# Patient Record
Sex: Female | Born: 1955 | Race: White | Hispanic: No | Marital: Married | Smoking: Former smoker
Health system: Southern US, Community
[De-identification: ages and names within clinical notes are randomized; demographics above are authoritative.]

## PROBLEM LIST (undated history)

## (undated) DIAGNOSIS — J45909 Unspecified asthma, uncomplicated: Secondary | ICD-10-CM

## (undated) DIAGNOSIS — M2012 Hallux valgus (acquired), left foot: Secondary | ICD-10-CM

## (undated) DIAGNOSIS — M21612 Bunion of left foot: Secondary | ICD-10-CM

## (undated) DIAGNOSIS — D649 Anemia, unspecified: Secondary | ICD-10-CM

## (undated) DIAGNOSIS — E785 Hyperlipidemia, unspecified: Secondary | ICD-10-CM

## (undated) DIAGNOSIS — R7303 Prediabetes: Secondary | ICD-10-CM

## (undated) DIAGNOSIS — T7840XA Allergy, unspecified, initial encounter: Secondary | ICD-10-CM

## (undated) DIAGNOSIS — F419 Anxiety disorder, unspecified: Secondary | ICD-10-CM

## (undated) DIAGNOSIS — M1990 Unspecified osteoarthritis, unspecified site: Secondary | ICD-10-CM

## (undated) DIAGNOSIS — J189 Pneumonia, unspecified organism: Secondary | ICD-10-CM

## (undated) DIAGNOSIS — K219 Gastro-esophageal reflux disease without esophagitis: Secondary | ICD-10-CM

## (undated) HISTORY — DX: Anxiety disorder, unspecified: F41.9

## (undated) HISTORY — PX: TONSILLECTOMY AND ADENOIDECTOMY: SUR1326

## (undated) HISTORY — DX: Gastro-esophageal reflux disease without esophagitis: K21.9

## (undated) HISTORY — PX: ABDOMINAL HYSTERECTOMY: SHX81

## (undated) HISTORY — DX: Allergy, unspecified, initial encounter: T78.40XA

## (undated) HISTORY — DX: Unspecified osteoarthritis, unspecified site: M19.90

## (undated) HISTORY — PX: OOPHORECTOMY: SHX86

## (undated) HISTORY — DX: Hyperlipidemia, unspecified: E78.5

## (undated) HISTORY — PX: CARPAL TUNNEL RELEASE: SHX101

---

## 2006-10-20 DIAGNOSIS — Z87898 Personal history of other specified conditions: Secondary | ICD-10-CM | POA: Insufficient documentation

## 2006-10-20 DIAGNOSIS — E229 Hyperfunction of pituitary gland, unspecified: Secondary | ICD-10-CM

## 2006-10-20 DIAGNOSIS — F419 Anxiety disorder, unspecified: Secondary | ICD-10-CM | POA: Insufficient documentation

## 2006-10-20 DIAGNOSIS — E7800 Pure hypercholesterolemia, unspecified: Secondary | ICD-10-CM | POA: Insufficient documentation

## 2006-10-20 DIAGNOSIS — F32A Depression, unspecified: Secondary | ICD-10-CM | POA: Insufficient documentation

## 2006-10-20 DIAGNOSIS — F329 Major depressive disorder, single episode, unspecified: Secondary | ICD-10-CM

## 2006-11-08 DIAGNOSIS — M949 Disorder of cartilage, unspecified: Secondary | ICD-10-CM

## 2006-11-08 DIAGNOSIS — M899 Disorder of bone, unspecified: Secondary | ICD-10-CM | POA: Insufficient documentation

## 2007-04-01 DIAGNOSIS — K219 Gastro-esophageal reflux disease without esophagitis: Secondary | ICD-10-CM | POA: Insufficient documentation

## 2007-09-02 DIAGNOSIS — E559 Vitamin D deficiency, unspecified: Secondary | ICD-10-CM | POA: Insufficient documentation

## 2008-07-16 DIAGNOSIS — J45909 Unspecified asthma, uncomplicated: Secondary | ICD-10-CM | POA: Insufficient documentation

## 2009-09-28 HISTORY — PX: KNEE SURGERY: SHX244

## 2010-02-28 HISTORY — PX: COLONOSCOPY: SHX174

## 2010-02-28 HISTORY — PX: UPPER GASTROINTESTINAL ENDOSCOPY: SHX188

## 2013-05-22 LAB — BASIC METABOLIC PANEL
BUN: 14 mg/dL (ref 4–21)
Creatinine: 0.7 mg/dL (ref <=1.1)
GLUCOSE: 91 mg/dL
Potassium: 4.8 mmol/L (ref 3.4–5.3)
Sodium: 141 mmol/L (ref 137–147)

## 2013-05-22 LAB — HEPATIC FUNCTION PANEL
ALK PHOS: 89 U/L (ref 25–125)
ALT: 28 U/L (ref 7–35)
AST: 30 U/L (ref 13–35)
BILIRUBIN, TOTAL: 0.5 mg/dL

## 2014-07-03 LAB — CBC AND DIFFERENTIAL
HCT: 39 % (ref 36–46)
Hemoglobin: 13.3 g/dL (ref 12.0–16.0)
NEUTROS ABS: 2 /uL
PLATELETS: 254 10*3/uL (ref 150–399)
WBC: 4.4 10*3/mL

## 2014-07-03 LAB — TSH: TSH: 1.8 u[IU]/mL (ref 0.41–5.90)

## 2014-07-03 LAB — LIPID PANEL
Cholesterol: 276 mg/dL — AB (ref 0–200)
HDL: 101 mg/dL — AB (ref 35–70)
LDL CALC: 156 mg/dL
Triglycerides: 94 mg/dL (ref 40–160)

## 2014-09-12 ENCOUNTER — Other Ambulatory Visit: Payer: Self-pay | Admitting: Family Medicine

## 2014-12-11 ENCOUNTER — Other Ambulatory Visit: Payer: Self-pay | Admitting: Family Medicine

## 2015-02-10 ENCOUNTER — Encounter: Payer: Self-pay | Admitting: Family Medicine

## 2015-02-10 ENCOUNTER — Other Ambulatory Visit: Payer: Self-pay

## 2015-02-10 ENCOUNTER — Ambulatory Visit (INDEPENDENT_AMBULATORY_CARE_PROVIDER_SITE_OTHER): Payer: Managed Care, Other (non HMO) | Admitting: Family Medicine

## 2015-02-10 VITALS — BP 138/82 | HR 92 | Temp 98.2°F | Resp 16 | Wt 133.8 lb

## 2015-02-10 DIAGNOSIS — B029 Zoster without complications: Secondary | ICD-10-CM | POA: Insufficient documentation

## 2015-02-10 DIAGNOSIS — M7130 Other bursal cyst, unspecified site: Secondary | ICD-10-CM | POA: Insufficient documentation

## 2015-02-10 DIAGNOSIS — M8580 Other specified disorders of bone density and structure, unspecified site: Secondary | ICD-10-CM | POA: Insufficient documentation

## 2015-02-10 DIAGNOSIS — R195 Other fecal abnormalities: Secondary | ICD-10-CM | POA: Insufficient documentation

## 2015-02-10 DIAGNOSIS — F419 Anxiety disorder, unspecified: Secondary | ICD-10-CM | POA: Insufficient documentation

## 2015-02-10 DIAGNOSIS — J45901 Unspecified asthma with (acute) exacerbation: Secondary | ICD-10-CM | POA: Diagnosis not present

## 2015-02-10 MED ORDER — PREDNISONE 5 MG PO TABS: 5.0000 mg | ORAL_TABLET | Freq: Every day | ORAL | Status: DC

## 2015-02-10 MED ORDER — LEVOFLOXACIN 500 MG PO TABS: 500.0000 mg | ORAL_TABLET | Freq: Every day | ORAL | Status: DC

## 2015-02-10 MED ORDER — IPRATROPIUM-ALBUTEROL 0.5-2.5 (3) MG/3ML IN SOLN
3.0000 mL | Freq: Once | RESPIRATORY_TRACT | Status: AC
  Administered 2015-02-10: 3 mL via RESPIRATORY_TRACT

## 2015-02-10 NOTE — Patient Instructions (Signed)
Asthma Attack Prevention While you may not be able to control the fact that you have asthma, you can take actions to prevent asthma attacks. The best way to prevent asthma attacks is to maintain good control of your asthma. You can achieve this by:  Taking your medicines as directed.  Avoiding things that can irritate your airways or make your asthma symptoms worse (asthma triggers).  Keeping track of how well your asthma is controlled and of any changes in your symptoms.  Responding quickly to worsening asthma symptoms (asthma attack).  Seeking emergency care when it is needed. WHAT ARE SOME WAYS TO PREVENT AN ASTHMA ATTACK? Have a Plan Work with your health care provider to create a written plan for managing and treating your asthma attacks (asthma action plan). This plan includes:  A list of your asthma triggers and how you can avoid them.  Information on when medicines should be taken and when their dosages should be changed.  The use of a device that measures how well your lungs are working (peak flow meter). Monitor Your Asthma Use your peak flow meter and record your results in a journal every day. A drop in your peak flow numbers on one or more days may indicate the start of an asthma attack. This can happen even before you start to feel symptoms. You can prevent an asthma attack from getting worse by following the steps in your asthma action plan. Avoid Asthma Triggers Work with your asthma health care provider to find out what your asthma triggers are. This can be done by:  Allergy testing.  Keeping a journal that notes when asthma attacks occur and the factors that may have contributed to them.  Determining if there are other medical conditions that are making your asthma worse. Once you have determined your asthma triggers, take steps to avoid them. This may include avoiding excessive or prolonged exposure to:  Dust. Have someone dust and vacuum your home for you once or  twice a week. Using a high-efficiency particulate arrestance (HEPA) vacuum is best.  Smoke. This includes campfire smoke, forest fire smoke, and secondhand smoke from tobacco products.  Pet dander. Avoid contact with animals that you know you are allergic to.  Allergens from trees, grasses or pollens. Avoid spending a lot of time outdoors when pollen counts are high, and on very windy days.  Very cold, dry, or humid air.  Mold.  Foods that contain high amounts of sulfites.  Strong odors.  Outdoor air pollutants, such as engine exhaust.  Indoor air pollutants, such as aerosol sprays and fumes from household cleaners.  Household pests, including dust mites and cockroaches, and pest droppings.  Certain medicines, including NSAIDs. Always talk to your health care provider before stopping or starting any new medicines. Medicines Take over-the-counter and prescription medicines only as told by your health care provider. Many asthma attacks can be prevented by carefully following your medicine schedule. Taking your medicines correctly is especially important when you cannot avoid certain asthma triggers. Act Quickly If an asthma attack does happen, acting quickly can decrease how severe it is and how long it lasts. Take these steps:   Pay attention to your symptoms. If you are coughing, wheezing, or having difficulty breathing, do not wait to see if your symptoms go away on their own. Follow your asthma action plan.  If you have followed your asthma action plan and your symptoms are not improving, call your health care provider or seek immediate medical care   at the nearest hospital. It is important to note how often you need to use your fast-acting rescue inhaler. If you are using your rescue inhaler more often, it may mean that your asthma is not under control. Adjusting your asthma treatment plan may help you to prevent future asthma attacks and help you to gain better control of your  condition. HOW CAN I PREVENT AN ASTHMA ATTACK WHEN I EXERCISE? Follow advice from your health care provider about whether you should use your fast-acting inhaler before exercising. Many people with asthma experience exercise-induced bronchoconstriction (EIB). This condition often worsens during vigorous exercise in cold, humid, or dry environments. Usually, people with EIB can stay very active by pre-treating with a fast-acting inhaler before exercising.   This information is not intended to replace advice given to you by your health care provider. Make sure you discuss any questions you have with your health care provider.   Document Released: 02/02/2009 Document Revised: 11/05/2014 Document Reviewed: 07/17/2014 Elsevier Interactive Patient Education 2016 [employer]  

## 2015-02-10 NOTE — Progress Notes (Signed)
Patient ID: Kristen Sandoval, female   DOB: 1955/06/14, 59 y.o.   MRN: [id number]   Patient: Kristen Sandoval Female    DOB: May 31, 1955   59 y.o.   MRN: [id number] Visit Date: 02/10/2015  Today's Provider: Vernie Murders, PA   Chief Complaint  Patient presents with  . Cough  . Wheezing   Subjective:    Cough This is a new problem. The current episode started in the past 7 days. The problem has been gradually worsening. The cough is non-productive. Associated symptoms include shortness of breath and wheezing. The symptoms are aggravated by exercise, lying down and cold air. She has tried a beta-agonist inhaler and steroid inhaler for the symptoms. The treatment provided no relief. Her past medical history is significant for asthma.   Patient Active Problem List   Diagnosis Date Noted  . Anxiety 02/10/2015  . Osteopenia 02/10/2015  . 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 Nonspecific abnormal finding in stool contents 02/10/2015  . 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 Avitaminosis D 09/02/2007  . Acid reflux 04/01/2007  . 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 Clinical depression 10/20/2006  . 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 Hypercholesterolemia without hypertriglyceridemia 10/20/2006   Past Surgical History  Procedure Laterality Date  . Abdominal hysterectomy  1990's  . Oophorectomy Bilateral 1990's  . Knee surgery Left 09/2009   Family History  Problem Relation Age of Onset  . Adopted: Yes  . Lung cancer Mother       Allergies  Allergen Reactions  . Erythromycin     vomiting  . Latex      Previous Medications   ALBUTEROL (PROAIR HFA) 108 (90 BASE) MCG/ACT INHALER    Inhale into the lungs.   FLUTICASONE (FLONASE) 50 MCG/ACT NASAL SPRAY       QVAR 80 MCG/ACT INHALER    TAKE 1-2 PUFFS EVERY MORNING AND 1-2 PUFF EVERY EVENING   SERTRALINE (ZOLOFT) 50 MG TABLET    TAKE ONE TABLET EVERY DAY    Review of Systems  Constitutional: Negative.   HENT:  Negative.   Eyes: Negative.   Respiratory: Positive for cough, shortness of breath and wheezing.   Cardiovascular: Negative.   Gastrointestinal: Negative.   Endocrine: Negative.   Genitourinary: Negative.   Musculoskeletal: Negative.   Skin: Negative.   Allergic/Immunologic: Negative.   Neurological: Negative.   Hematological: Negative.   Psychiatric/Behavioral: Negative.     Social History  Substance Use Topics  . Smoking status: Former Research scientist (life sciences)  . Smokeless tobacco: Not on file  . Alcohol Use: 0.0 oz/week    0 Standard drinks or equivalent per week     Comment: Moderate use   Objective:   BP 138/82 mmHg  Pulse 92  Temp(Src) 98.2 F (36.8 C) (Oral)  Resp 16  Wt 133 lb 12.8 oz (60.691 kg)  SpO2 95%  Physical Exam  Constitutional: She is oriented to person, place, and time. She appears well-developed and well-nourished.  HENT:  Head: Normocephalic.  Right Ear: External ear normal.  Left Ear: External ear normal.  Nose: Nose normal.  Mouth/Throat: Oropharynx is clear and moist.  Eyes: Conjunctivae and EOM are normal.  Neck: Normal range of motion. Neck supple.  Cardiovascular: Normal rate, regular rhythm and normal heart sounds.   Pulmonary/Chest: She has wheezes.  Slightly labored respirations.  Abdominal: Soft. Bowel sounds are normal.  Lymphadenopathy:    She has no cervical adenopathy.  Neurological: She is alert and oriented to person, place,  and time.  Psychiatric: She has a normal mood and affect. Her behavior is normal.      Assessment & Plan:      1. Asthma with exacerbation, unspecified asthma severity Recent onset with bronchitis. Still slight wheeze after nebulizer treatment with Duoneb. Will treat with prednisone taper, Levaquin 500 mg qd and continue Albuterol QID with QVAR 80 mcg 2 puffs BID. Check CXR tomorrow and follow up appointment in 1 week. - ipratropium-albuterol (DUONEB) 0.5-2.5 (3) MG/3ML nebulizer solution 3 mL; Take 3 mLs by nebulization  once. - levofloxacin (LEVAQUIN) 500 MG tablet; Take 1 tablet (500 mg total) by mouth daily.  Dispense: 7 tablet; Refill: 0 - predniSONE (DELTASONE) 5 MG tablet; Take 1 tablet (5 mg total) by mouth daily with breakfast. Taper down by one tablet daily for 6 days (6,5,4,3,2,1)  Dispense: 21 tablet; Refill: 0 - DG Chest 2 View

## 2015-02-11 ENCOUNTER — Ambulatory Visit
Admission: RE | Admit: 2015-02-11 | Discharge: 2015-02-11 | Disposition: A | Discharge status: Home / Self Care | Payer: Managed Care, Other (non HMO) | Source: Ambulatory Visit | Attending: Family Medicine | Admitting: Family Medicine

## 2015-02-11 ENCOUNTER — Telehealth: Payer: Self-pay | Admitting: Family Medicine

## 2015-02-11 DIAGNOSIS — J441 Chronic obstructive pulmonary disease with (acute) exacerbation: Secondary | ICD-10-CM | POA: Diagnosis not present

## 2015-02-11 DIAGNOSIS — R05 Cough: Secondary | ICD-10-CM | POA: Diagnosis present

## 2015-02-11 DIAGNOSIS — J45901 Unspecified asthma with (acute) exacerbation: Secondary | ICD-10-CM | POA: Diagnosis present

## 2015-02-13 ENCOUNTER — Telehealth: Payer: Self-pay

## 2015-02-13 NOTE — Telephone Encounter (Signed)
-----   Message from Margo Common, [place] sent at 02/12/2015  4:43 PM EST ----- Chest x-ray shows mild hyperinflation consistent with COPD. Continue present medications and recheck as planned. Probably will need lung function test and chronic inhaled medication if no better.

## 2015-02-13 NOTE — Telephone Encounter (Signed)
Patient advised as directed below. Patient verbalized understanding. Patient has a scheduled follow up appointment on 12/22.

## 2015-02-19 ENCOUNTER — Ambulatory Visit: Payer: Managed Care, Other (non HMO) | Admitting: Family Medicine

## 2015-03-06 ENCOUNTER — Encounter: Payer: Self-pay | Admitting: Family Medicine

## 2015-03-25 ENCOUNTER — Encounter: Payer: Self-pay | Admitting: Family Medicine

## 2015-03-25 ENCOUNTER — Ambulatory Visit (INDEPENDENT_AMBULATORY_CARE_PROVIDER_SITE_OTHER): Payer: Managed Care, Other (non HMO) | Admitting: Family Medicine

## 2015-03-25 VITALS — BP 138/82 | HR 72 | Temp 98.2°F | Resp 16 | Wt 131.0 lb

## 2015-03-25 DIAGNOSIS — J4521 Mild intermittent asthma with (acute) exacerbation: Secondary | ICD-10-CM | POA: Diagnosis not present

## 2015-03-25 DIAGNOSIS — R05 Cough: Secondary | ICD-10-CM

## 2015-03-25 DIAGNOSIS — R059 Cough, unspecified: Secondary | ICD-10-CM

## 2015-03-25 NOTE — Progress Notes (Signed)
Subjective:    Patient ID: Kristen Sandoval, female    DOB: 1955-09-01, 60 y.o.   MRN: [id number]  Asthma She complains of chest tightness (with coughing), cough and difficulty breathing. There is no frequent throat clearing, hemoptysis, hoarse voice, shortness of breath, sputum production or wheezing. This is a chronic (LOV 02/10/2015 with Simona Huh. Started Levaquin, Prednisone and had Duoneb in office for bronchitis. Also had CXR performed, which showed mild hyperinflation consistent with COPD) problem. The problem has been gradually worsening. The cough is non-productive. Associated symptoms include dyspnea on exertion and malaise/fatigue. Pertinent negatives include no appetite change, chest pain, fever, nasal congestion, orthopnea, rhinorrhea, sneezing or sore throat. Relieved by: ProAir and Q Var. She reports moderate improvement on treatment. Her past medical history is significant for asthma and bronchitis.      Review of Systems  Constitutional: Positive for malaise/fatigue. Negative for fever and appetite change.  HENT: Negative for hoarse voice, rhinorrhea, sneezing and sore throat.   Respiratory: Positive for cough. Negative for hemoptysis, sputum production, shortness of breath and wheezing.   Cardiovascular: Positive for dyspnea on exertion. Negative for chest pain.   BP 138/82 mmHg  Pulse 72  Temp(Src) 98.2 F (36.8 C) (Oral)  Resp 16  Wt 131 lb (59.421 kg)  SpO2 99%   Patient Active Problem List   Diagnosis Date Noted  . Anxiety 02/10/2015  . Osteopenia 02/10/2015  . 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 Nonspecific abnormal finding in stool contents 02/10/2015  . 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 Avitaminosis D 09/02/2007  . Acid reflux 04/01/2007  . 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 Clinical depression 10/20/2006  . 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 Hypercholesterolemia without hypertriglyceridemia 10/20/2006   No past medical  history on file. Current Outpatient Prescriptions on File Prior to Visit  Medication Sig  . albuterol (PROAIR HFA) 108 (90 BASE) MCG/ACT inhaler Inhale into the lungs.  . fluticasone (FLONASE) 50 MCG/ACT nasal spray   . QVAR 80 MCG/ACT inhaler TAKE 1-2 PUFFS EVERY MORNING AND 1-2 PUFF EVERY EVENING  . sertraline (ZOLOFT) 50 MG tablet TAKE ONE TABLET EVERY DAY   No current facility-administered medications on file prior to visit.   Allergies  Allergen Reactions  . Erythromycin     vomiting  . Latex    Past Surgical History  Procedure Laterality Date  . Abdominal hysterectomy  1990's  . Oophorectomy Bilateral 1990's  . Knee surgery Left 09/2009   Social History   Social History  . Marital Status: Married    Spouse Name: N/A  . Number of Children: N/A  . Years of Education: N/A   Occupational History  . Not on file.   Social History Main Topics  . Smoking status: Former Smoker    Quit date: 02/27/2006  . Smokeless tobacco: Never Used  . Alcohol Use: 0.0 oz/week    0 Standard drinks or equivalent per week     Comment: Moderate use  . Drug Use: No  . Sexual Activity: Not on file   Other Topics Concern  . Not on file   Social History Narrative   Family History  Problem Relation Age of Onset  . Adopted: Yes  . Lung cancer Mother       Objective:   Physical Exam  Constitutional: She is oriented to person, place, and time. She appears well-developed and well-nourished.  HENT:  Mouth/Throat: Oropharynx is clear and moist.  Neck: Neck supple.  Pulmonary/Chest: Effort normal  and breath sounds normal.  Abdominal: Soft.  Neurological: She is alert and oriented to person, place, and time.  Skin: Skin is warm and dry.  Psychiatric: She has a normal mood and affect. Her behavior is normal. Judgment and thought content normal.          Assessment & Plan:  1. Cough Worsening. See plan below. - Spirometry with Graph  2. Airway hyperreactivity, mild intermittent,  with acute exacerbation Question asthma vs COPD. 2 samples of Advair 250/50 given at OV. FU 2 weeks. If no improvement at FU, will consider referral to pulmonology. I have done the exam and reviewed the above chart and it is accurate to the best of my knowledge.  Patient seen and examined by Miguel Aschoff, MD, and note scribed by Renaldo Fiddler, CMA.

## 2015-03-26 ENCOUNTER — Other Ambulatory Visit: Payer: Self-pay | Admitting: Family Medicine

## 2015-03-30 ENCOUNTER — Telehealth: Payer: Self-pay | Admitting: Family Medicine

## 2015-03-30 NOTE — Telephone Encounter (Signed)
Pt called saying the advair is working really well.  She is having no wheezing.  She wants to know if you will call it in for her at [facility].  She wants to know if she can cancel her appt with you for nexr Wednesday.  Her call back is  [phone]  Thanks Con Memos

## 2015-03-30 NOTE — Telephone Encounter (Signed)
Please review both questions thank you-aa

## 2015-03-31 MED ORDER — FLUTICASONE-SALMETEROL 250-50 MCG/DOSE IN AEPB: 1.0000 | INHALATION_SPRAY | Freq: Every day | RESPIRATORY_TRACT | Status: DC

## 2015-03-31 NOTE — Telephone Encounter (Signed)
RX sent in and pt advised-aa 

## 2015-03-31 NOTE — Telephone Encounter (Signed)
It does not have to be on Wednesday but we do need a follow-up visit on her asthma to discuss appropriate treatment going forward.

## 2015-04-08 ENCOUNTER — Ambulatory Visit: Payer: Managed Care, Other (non HMO) | Admitting: Family Medicine

## 2015-04-14 ENCOUNTER — Ambulatory Visit (INDEPENDENT_AMBULATORY_CARE_PROVIDER_SITE_OTHER): Payer: Managed Care, Other (non HMO) | Admitting: Family Medicine

## 2015-04-14 VITALS — BP 116/72 | HR 70 | Temp 97.6°F | Resp 14 | Wt 128.0 lb

## 2015-04-14 DIAGNOSIS — J45901 Unspecified asthma with (acute) exacerbation: Secondary | ICD-10-CM | POA: Diagnosis not present

## 2015-04-14 DIAGNOSIS — R05 Cough: Secondary | ICD-10-CM

## 2015-04-14 DIAGNOSIS — R059 Cough, unspecified: Secondary | ICD-10-CM

## 2015-04-14 NOTE — Progress Notes (Signed)
Patient ID: Kristen Sandoval, female   DOB: 1956-02-24, 60 y.o.   MRN: [id number]    Subjective:  HPI  Patient is here for follow up on Cough from 03/25/15 visit. She was started on Advair 250/50 and spirometry was checked. She doing so much better since starting on this inhaler. No more coughing spells.  Prior to Admission medications   Medication Sig Start Date End Date Taking? Authorizing Provider  fluticasone (FLONASE) 50 MCG/ACT nasal spray  11/14/14  Yes Historical Provider, MD  Fluticasone-Salmeterol (ADVAIR DISKUS) 250-50 MCG/DOSE AEPB Inhale 1 puff into the lungs daily. 03/31/15  Yes Richard Maceo Pro., MD  PROAIR HFA 108 [phone] [place]) MCG/ACT inhaler TAKE 2 PUFFS AS NEEDED BEFORE EXERCISE 03/26/15  Yes Jerrol Banana., MD  sertraline (ZOLOFT) 50 MG tablet TAKE ONE TABLET EVERY DAY 12/11/14  Yes Jerrol Banana., MD    Patient Active Problem List   Diagnosis Date Noted  . Anxiety 02/10/2015  . Osteopenia 02/10/2015  . 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 Nonspecific abnormal finding in stool contents 02/10/2015  . 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 Avitaminosis D 09/02/2007  . Acid reflux 04/01/2007  . 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 Clinical depression 10/20/2006  . 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 Hypercholesterolemia without hypertriglyceridemia 10/20/2006    No past medical history on file.  Social History   Social History  . Marital Status: Married    Spouse Name: N/A  . Number of Children: N/A  . Years of Education: N/A   Occupational History  . Not on file.   Social History Main Topics  . Smoking status: Former Smoker    Quit date: 02/27/2006  . Smokeless tobacco: Never Used  . Alcohol Use: 0.0 oz/week    0 Standard drinks or equivalent per week     Comment: Moderate use  . Drug Use: No  . Sexual Activity: Not on file   Other Topics Concern  . Not on file   Social History Narrative    Allergies    Allergen Reactions  . Erythromycin     vomiting  . Latex     Review of Systems  Constitutional: Negative.   Respiratory: Negative.   Cardiovascular: Negative.   Gastrointestinal: Negative.   Musculoskeletal: Negative.   Psychiatric/Behavioral: Negative.     Immunization History  Administered Date(s) Administered  . Influenza-Unspecified 11/25/2014  . Pneumococcal Polysaccharide-23 10/15/2012  . Tdap 04/16/2012  . Zoster 12/28/2012   Objective:  BP 116/72 mmHg  Pulse 70  Temp(Src) 97.6 F (36.4 C)  Resp 14  Wt 128 lb (58.06 kg)  SpO2 99%  Physical Exam  Constitutional: She is oriented to person, place, and time and well-developed, well-nourished, and in no distress.  HENT:  Head: Normocephalic and atraumatic.  Cardiovascular: Normal rate, regular rhythm, normal heart sounds and intact distal pulses.   Pulmonary/Chest: Effort normal and breath sounds normal. No respiratory distress. She has no wheezes.  Neurological: She is alert and oriented to person, place, and time.  Skin: Skin is warm.  Psychiatric: Mood, memory, affect and judgment normal.    Lab Results  Component Value Date   WBC 4.4 07/03/2014   HGB 13.3 07/03/2014   HCT 39 07/03/2014   PLT 254 07/03/2014   CHOL 276* 07/03/2014   TRIG 94 07/03/2014   HDL 101* 07/03/2014   LDLCALC 156 07/03/2014   TSH 1.80 07/03/2014    CMP     Component Value  Date/Time   NA 141 05/22/2013   K 4.8 05/22/2013   BUN 14 05/22/2013   CREATININE 0.7 05/22/2013   AST 30 05/22/2013   ALT 28 05/22/2013   ALKPHOS 89 05/22/2013    Assessment and Plan :  1. Cough Resolved on Advair. Lungs sound normal on the exam today. Qvar did not help patient so will not do the step down therapy. Advair is controlling so well for the patient and in this case will keep patient on Advair and follow.  2. Asthma with exacerbation, unspecified asthma severity Much improved per patient. Per guidelines twice a day he'll to cut back to  ICS only.   continue Advair for this individual patient as benefits outweigh risks.  I have done the exam and reviewed the above chart and it is accurate to the best of my knowledge.  I have done the exam and reviewed the above chart and it is accurate to the best of my knowledge.   Miguel Aschoff MD [facility] 04/14/2015 11:44 AM

## 2015-06-22 ENCOUNTER — Ambulatory Visit (INDEPENDENT_AMBULATORY_CARE_PROVIDER_SITE_OTHER): Payer: Managed Care, Other (non HMO) | Admitting: Family Medicine

## 2015-06-22 ENCOUNTER — Encounter: Payer: Self-pay | Admitting: Family Medicine

## 2015-06-22 VITALS — BP 106/62 | HR 78 | Temp 98.2°F | Resp 16 | Ht 61.0 in | Wt 127.0 lb

## 2015-06-22 DIAGNOSIS — Z1211 Encounter for screening for malignant neoplasm of colon: Secondary | ICD-10-CM | POA: Diagnosis not present

## 2015-06-22 DIAGNOSIS — Z0000 Encounter for general adult medical examination without abnormal findings: Secondary | ICD-10-CM

## 2015-06-22 DIAGNOSIS — Z1239 Encounter for other screening for malignant neoplasm of breast: Secondary | ICD-10-CM

## 2015-06-22 LAB — POCT URINALYSIS DIPSTICK
BILIRUBIN UA: NEGATIVE
Glucose, UA: NEGATIVE
KETONES UA: NEGATIVE
LEUKOCYTES UA: NEGATIVE
Nitrite, UA: NEGATIVE
Protein, UA: NEGATIVE
RBC UA: NEGATIVE
SPEC GRAV UA: 1.01
Urobilinogen, UA: 0.2
pH, UA: 5

## 2015-06-22 LAB — IFOBT (OCCULT BLOOD): IMMUNOLOGICAL FECAL OCCULT BLOOD TEST: NEGATIVE

## 2015-06-22 NOTE — Progress Notes (Signed)
Patient ID: Kristen Sandoval, female   DOB: 05-04-1955, 60 y.o.   MRN: [id number]       Patient: Kristen Sandoval, Female    DOB: Dec 22, 1955, 60 y.o.   MRN: [id number] Visit Date: 06/22/2015  Today's Provider: Wilhemena Durie, MD   Chief Complaint  Patient presents with  . Annual Exam   Subjective:    Annual physical exam Kristen Sandoval is a 60 y.o. female who presents today for health maintenance and complete physical. She feels well. She reports exercising 4 days a week, boot camp classes. She reports she is sleeping fairly well. Patient states she feels great. Her breathing is good. ----------------------------------------------------------------- Mammogram- 07/01/14 Normal BMD- 01/25/12 osteopenia Colonoscopy- 11/30/10 reflux, repeat 2022 Pap- hysterectomy   Immunization History  Administered Date(s) Administered  . Influenza-Unspecified 11/25/2014  . Pneumococcal Polysaccharide-23 10/15/2012  . Tdap 04/16/2012  . Zoster 12/28/2012     Review of Systems  Constitutional: Negative.   HENT: Negative.   Eyes: Negative.   Respiratory: Negative.   Cardiovascular: Negative.   Gastrointestinal: Negative.   Endocrine: Negative.   Genitourinary: Negative.   Musculoskeletal: Positive for arthralgias (has appt with ortho next week).  Allergic/Immunologic: Negative.   Neurological: Negative.   Hematological: Negative.   Psychiatric/Behavioral: Negative.     Social History      She  reports that she quit smoking about 9 years ago. She has never used smokeless tobacco. She reports that she drinks alcohol. She reports that she does not use illicit drugs.       Social History   Social History  . Marital Status: Married    Spouse Name: N/A  . Number of Children: N/A  . Years of Education: N/A   Social History Main Topics  . Smoking status: Former Smoker    Quit date: 02/27/2006  . Smokeless tobacco: Never Used  . Alcohol Use: 0.0 oz/week    0 Standard drinks or equivalent  per week     Comment: Moderate use  . Drug Use: No  . Sexual Activity: Not Asked   Other Topics Concern  . None   Social History Narrative    History reviewed. No pertinent past medical history.   Patient Active Problem List   Diagnosis Date Noted  . Anxiety 02/10/2015  . Osteopenia 02/10/2015  . 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 Nonspecific abnormal finding in stool contents 02/10/2015  . 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 Avitaminosis D 09/02/2007  . Acid reflux 04/01/2007  . 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 Clinical depression 10/20/2006  . 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 Hypercholesterolemia without hypertriglyceridemia 10/20/2006    Past Surgical History  Procedure Laterality Date  . Abdominal hysterectomy  1990's  . Oophorectomy Bilateral 1990's  . Knee surgery Left 09/2009    Family History        Family Status  Relation Status Death Age  . Mother Deceased     suicide  . Daughter Alive   . Son Alive         Her family history includes Lung cancer in her mother. She was adopted.    Allergies  Allergen Reactions  . Erythromycin     vomiting  . Latex     Previous Medications   FLUTICASONE (FLONASE) 50 MCG/ACT NASAL SPRAY       FLUTICASONE-SALMETEROL (ADVAIR DISKUS) 250-50 MCG/DOSE AEPB    Inhale 1 puff into the lungs daily.   PROAIR HFA 108 (90 BASE) MCG/ACT  INHALER    TAKE 2 PUFFS AS NEEDED BEFORE EXERCISE   SERTRALINE (ZOLOFT) 50 MG TABLET    TAKE ONE TABLET EVERY DAY    Patient Care Team: Jerrol Banana., MD as PCP - General (Family Medicine)     Objective:   Vitals: BP 106/62 mmHg  Pulse 78  Temp(Src) 98.2 F (36.8 C) (Oral)  Resp 16  Ht 5\' 1"  (1.549 m)  Wt 127 lb (57.607 kg)  BMI 24.01 kg/m2   Physical Exam  Constitutional: She is oriented to person, place, and time. She appears well-developed and well-nourished.  HENT:  Head: Normocephalic and atraumatic.  Right Ear: External ear  normal.  Left Ear: External ear normal.  Nose: Nose normal.  Mouth/Throat: Oropharynx is clear and moist.  Eyes: Conjunctivae and EOM are normal. Pupils are equal, round, and reactive to light.  Neck: Normal range of motion. Neck supple.  Cardiovascular: Normal rate, regular rhythm, normal heart sounds and intact distal pulses.   Pulmonary/Chest: Effort normal and breath sounds normal.  Abdominal: Soft. Bowel sounds are normal.  Genitourinary: Guaiac negative stool ( Encouraged patient to continue regular exercise.).  DRE normal.  Musculoskeletal: Normal range of motion.  Neurological: She is alert and oriented to person, place, and time. She has normal reflexes.  Skin: Skin is warm and dry.  Psychiatric: She has a normal mood and affect. Her behavior is normal. Judgment and thought content normal.  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 Influenza-Unspecified 11/25/2014  . Pneumococcal Polysaccharide-23 10/15/2012  . Tdap 04/16/2012  . Zoster 12/28/2012    Health Maintenance  Topic Date Due  . Hepatitis C Screening  Jan 23, 1956  . HIV Screening  11/20/1972  . PAP SMEAR  11/21/1978  . INFLUENZA VACCINE  09/29/2015  . MAMMOGRAM  06/30/2016  . COLONOSCOPY  11/29/2020  . TETANUS/TDAP  04/16/2022     overall health is very good.encourage patient to continue regular exercise. Discussed health benefits of physical activity, and encouraged her to engage in regular exercise appropriate for her age and condition. Patient states that she get the shingles vaccine in 2015.             Status post TAH I have done the exam and reviewed the above chart and it is accurate to the best of my  knowledge.  --------------------------------------------------------------------

## 2015-07-14 ENCOUNTER — Telehealth: Payer: Self-pay | Admitting: Family Medicine

## 2015-07-14 NOTE — Telephone Encounter (Signed)
Ok to give samples if we have any

## 2015-07-14 NOTE — Telephone Encounter (Signed)
Ok if we have any. 

## 2015-07-14 NOTE — Telephone Encounter (Signed)
Sample provided x 1. Pt advised,. Placed up front-aa

## 2015-07-14 NOTE — Telephone Encounter (Signed)
Pt is requesting samples of the Advair diskus 250/50.  CB#[phone]/MW

## 2015-07-24 ENCOUNTER — Telehealth: Payer: Self-pay

## 2015-07-24 LAB — COMPREHENSIVE METABOLIC PANEL
ALT: 17 IU/L (ref 0–32)
AST: 23 IU/L (ref 0–40)
Albumin/Globulin Ratio: 2 (ref 1.2–2.2)
Albumin: 4.6 g/dL (ref 3.5–5.5)
Alkaline Phosphatase: 86 IU/L (ref 39–117)
BUN/Creatinine Ratio: 19 (ref 9–23)
BUN: 12 mg/dL (ref 6–24)
Bilirubin Total: 0.4 mg/dL (ref 0.0–1.2)
CO2: 23 mmol/L (ref 18–29)
CREATININE: 0.64 mg/dL (ref 0.57–1.00)
Calcium: 9.3 mg/dL (ref 8.7–10.2)
Chloride: 102 mmol/L (ref 96–106)
GFR calc Af Amer: 115 mL/min/{1.73_m2} (ref >59)
GFR, EST NON AFRICAN AMERICAN: 99 mL/min/{1.73_m2} (ref >59)
GLUCOSE: 88 mg/dL (ref 65–99)
Globulin, Total: 2.3 g/dL (ref 1.5–4.5)
Potassium: 4.5 mmol/L (ref 3.5–5.2)
Sodium: 141 mmol/L (ref 134–144)
Total Protein: 6.9 g/dL (ref 6.0–8.5)

## 2015-07-24 LAB — CBC WITH DIFFERENTIAL/PLATELET
BASOS ABS: 0 10*3/uL (ref 0.0–0.2)
BASOS: 0 %
EOS (ABSOLUTE): 0.2 10*3/uL (ref 0.0–0.4)
Eos: 4 %
HEMOGLOBIN: 12.9 g/dL (ref 11.1–15.9)
Hematocrit: 38.2 % (ref 34.0–46.6)
IMMATURE GRANS (ABS): 0 10*3/uL (ref 0.0–0.1)
IMMATURE GRANULOCYTES: 0 %
LYMPHS: 36 %
Lymphocytes Absolute: 1.6 10*3/uL (ref 0.7–3.1)
MCH: 30.6 pg (ref 26.6–33.0)
MCHC: 33.8 g/dL (ref 31.5–35.7)
MCV: 91 fL (ref 79–97)
MONOCYTES: 7 %
Monocytes Absolute: 0.3 10*3/uL (ref 0.1–0.9)
NEUTROS ABS: 2.3 10*3/uL (ref 1.4–7.0)
NEUTROS PCT: 53 %
Platelets: 266 10*3/uL (ref 150–379)
RBC: 4.22 x10E6/uL (ref 3.77–5.28)
RDW: 13.4 % (ref 12.3–15.4)
WBC: 4.4 10*3/uL (ref 3.4–10.8)

## 2015-07-24 LAB — LIPID PANEL WITH LDL/HDL RATIO
CHOLESTEROL TOTAL: 241 mg/dL — AB (ref 100–199)
HDL: 93 mg/dL (ref >39)
LDL CALC: 134 mg/dL — AB (ref 0–99)
LDl/HDL Ratio: 1.4 ratio units (ref 0.0–3.2)
Triglycerides: 71 mg/dL (ref 0–149)
VLDL CHOLESTEROL CAL: 14 mg/dL (ref 5–40)

## 2015-07-24 LAB — TSH: TSH: 1.72 u[IU]/mL (ref 0.450–4.500)

## 2015-07-24 NOTE — Telephone Encounter (Signed)
Advised pt of lab results. Pt verbally acknowledges understanding. Kristen Sandoval, CMA   

## 2015-07-24 NOTE — Telephone Encounter (Signed)
-----   Message from Margarita Rana, MD sent at 07/24/2015  4:17 PM EDT ----- Labs stable. Cholesterol mildly elevated at 241, but does have high good cholesterol.  10 year risk of heart disease is only 2 percent and medical intervention is not recommended. Eat healthy and exercise.  Thanks.

## 2015-07-29 ENCOUNTER — Encounter: Payer: Self-pay | Admitting: Family Medicine

## 2015-08-25 ENCOUNTER — Other Ambulatory Visit: Payer: Self-pay | Admitting: Family Medicine

## 2015-10-05 ENCOUNTER — Ambulatory Visit (INDEPENDENT_AMBULATORY_CARE_PROVIDER_SITE_OTHER): Payer: Managed Care, Other (non HMO) | Admitting: Family Medicine

## 2015-10-05 ENCOUNTER — Encounter: Payer: Self-pay | Admitting: Family Medicine

## 2015-10-05 VITALS — BP 126/78 | Temp 98.8°F | Resp 16 | Wt 118.0 lb

## 2015-10-05 DIAGNOSIS — R197 Diarrhea, unspecified: Secondary | ICD-10-CM | POA: Diagnosis not present

## 2015-10-05 DIAGNOSIS — R1013 Epigastric pain: Secondary | ICD-10-CM | POA: Diagnosis not present

## 2015-10-05 MED ORDER — OMEPRAZOLE 20 MG PO CPDR
20.0000 mg | DELAYED_RELEASE_CAPSULE | Freq: Two times a day (BID) | ORAL | 3 refills | Status: DC
Start: 2015-10-05 — End: 2015-12-22

## 2015-10-05 NOTE — Patient Instructions (Addendum)
Start Metamucil once daily.

## 2015-10-05 NOTE — Progress Notes (Signed)
Patient: Kristen Sandoval Female    DOB: 07-Jul-1955   60 y.o.   MRN: [id number] Visit Date: 10/05/2015  Today's Provider: Wilhemena Durie, MD   Chief Complaint  Patient presents with  . Diarrhea   Subjective:    HPI Patient reports that she has noticed an increase in her bowels over the last 2 months. Patient reports that she started boot camp about 9 months ago, and has changed her diet tremously. Patient reports that she notices that immediately after eating she has to have a BM. Patient reports that at times it can be uncontrollable. Patient also mentions that 3 days ago, her abdominal pain radiated to her sternum. Patient reports that she still has her gallbladder. She did have pain in her upper right quadrant in the past, but reports it resolved on its own. Patient also mentions that she has well water, but drinks bottle water instead. She has not tried anything OTC for symptoms.     Allergies  Allergen Reactions  . Erythromycin     vomiting  . Latex    Current Meds  Medication Sig  . fluticasone (FLONASE) 50 MCG/ACT nasal spray   . Fluticasone-Salmeterol (ADVAIR DISKUS) 250-50 MCG/DOSE AEPB Inhale 1 puff into the lungs daily.  Marland Kitchen PROAIR HFA 108 (90 Base) MCG/ACT inhaler TAKE 2 PUFFS AS NEEDED BEFORE EXERCISE  . sertraline (ZOLOFT) 50 MG tablet TAKE ONE TABLET BY MOUTH EVERY DAY    Review of Systems  Constitutional: Positive for activity change, appetite change and unexpected weight change. Negative for chills, diaphoresis, fatigue and fever.  Eyes: Negative.   Respiratory: Negative.   Cardiovascular: Negative.   Gastrointestinal: Positive for abdominal pain, diarrhea and nausea. Negative for anal bleeding, blood in stool, constipation, rectal pain and vomiting.  Endocrine: Negative.   Musculoskeletal: Negative.   Allergic/Immunologic: Negative.   Neurological: Negative.   Hematological: Negative.   Psychiatric/Behavioral: Negative.     Social History    Substance Use Topics  . Smoking status: Former Smoker    Quit date: 02/27/2006  . Smokeless tobacco: Never Used  . Alcohol use 0.0 oz/week     Comment: Moderate use   Objective:   BP 126/78 (BP Location: Right Arm, Patient Position: Sitting, Cuff Size: Normal)   Temp 98.8 F (37.1 C)   Resp 16   Wt 118 lb (53.5 kg)   BMI 22.30 kg/m   Physical Exam  Constitutional: She is oriented to person, place, and time. She appears well-developed and well-nourished.  HENT:  Head: Normocephalic and atraumatic.  Eyes: Conjunctivae are normal. No scleral icterus.  Neck: Normal range of motion. Neck supple. No thyromegaly present.  Cardiovascular: Normal rate, regular rhythm and normal heart sounds.   Pulmonary/Chest: Effort normal and breath sounds normal.  Abdominal: Soft. There is tenderness.  Tenderness in epigastric region. Positive Percell Miller Sign more in the right.   Lymphadenopathy:    She has no cervical adenopathy.  Neurological: She is alert and oriented to person, place, and time.  Skin: Skin is warm and dry.  Psychiatric: She has a normal mood and affect. Her behavior is normal. Judgment and thought content normal.        Assessment & Plan:     1. Epigastric pain F/U pending labs and [place] report. Start Metamucil once daily and omeprazole BID. F/U in 10 days to reassess symptoms.  - Comprehensive metabolic panel - H. pylori antibody, IgG - omeprazole (PRILOSEC) 20 MG capsule; Take 1  capsule (20 mg total) by mouth 2 (two) times daily before a meal.  Dispense: 60 capsule; Refill: 3     Plan to go to 1 daily in 2 weeks. - US Abdomen Limited RUQ; Future  2. Diarrhea, unspecified type Secondary to epigastric pain. Patient reports that she has been having 3 BMs every hour. Will check CBC and F/U pending result.  - CBC with Differential/Platelet        Wilhemena Durie, MD  [facility]

## 2015-10-06 ENCOUNTER — Telehealth: Payer: Self-pay

## 2015-10-06 LAB — COMPREHENSIVE METABOLIC PANEL
ALK PHOS: 100 IU/L (ref 39–117)
ALT: 20 IU/L (ref 0–32)
AST: 32 IU/L (ref 0–40)
Albumin/Globulin Ratio: 1.4 (ref 1.2–2.2)
Albumin: 4.9 g/dL (ref 3.5–5.5)
BILIRUBIN TOTAL: 0.3 mg/dL (ref 0.0–1.2)
BUN / CREAT RATIO: 23 (ref 9–23)
BUN: 21 mg/dL (ref 6–24)
CO2: 18 mmol/L (ref 18–29)
CREATININE: 0.92 mg/dL (ref 0.57–1.00)
Calcium: 9.8 mg/dL (ref 8.7–10.2)
Chloride: 97 mmol/L (ref 96–106)
GFR calc non Af Amer: 69 mL/min/{1.73_m2} (ref >59)
GFR, EST AFRICAN AMERICAN: 80 mL/min/{1.73_m2} (ref >59)
GLUCOSE: 99 mg/dL (ref 65–99)
Globulin, Total: 3.4 g/dL (ref 1.5–4.5)
POTASSIUM: 4.4 mmol/L (ref 3.5–5.2)
SODIUM: 137 mmol/L (ref 134–144)
TOTAL PROTEIN: 8.3 g/dL (ref 6.0–8.5)

## 2015-10-06 LAB — CBC WITH DIFFERENTIAL/PLATELET
BASOS ABS: 0 10*3/uL (ref 0.0–0.2)
Basos: 1 %
EOS (ABSOLUTE): 0.1 10*3/uL (ref 0.0–0.4)
Eos: 2 %
HEMOGLOBIN: 15.4 g/dL (ref 11.1–15.9)
Hematocrit: 45.5 % (ref 34.0–46.6)
IMMATURE GRANS (ABS): 0 10*3/uL (ref 0.0–0.1)
Immature Granulocytes: 0 %
LYMPHS: 30 %
Lymphocytes Absolute: 1.5 10*3/uL (ref 0.7–3.1)
MCH: 30.5 pg (ref 26.6–33.0)
MCHC: 33.8 g/dL (ref 31.5–35.7)
MCV: 90 fL (ref 79–97)
MONOCYTES: 13 %
Monocytes Absolute: 0.6 10*3/uL (ref 0.1–0.9)
NEUTROS ABS: 2.7 10*3/uL (ref 1.4–7.0)
Neutrophils: 54 %
PLATELETS: 243 10*3/uL (ref 150–379)
RBC: 5.05 x10E6/uL (ref 3.77–5.28)
RDW: 13.6 % (ref 12.3–15.4)
WBC: 5 10*3/uL (ref 3.4–10.8)

## 2015-10-06 LAB — H. PYLORI ANTIBODY, IGG: H Pylori IgG: <0.9 U/mL (ref 0.0–0.8)

## 2015-10-06 NOTE — Telephone Encounter (Signed)
Pt is returning call.  CB#[phone]/MW

## 2015-10-06 NOTE — Telephone Encounter (Signed)
Left message to call back  

## 2015-10-06 NOTE — Telephone Encounter (Signed)
Advised patient of results.  

## 2015-10-06 NOTE — Telephone Encounter (Signed)
-----   Message from Jerrol Banana., MD sent at 10/06/2015  8:45 AM EDT ----- Labs normal, including normal helicobacter.

## 2015-10-06 NOTE — Telephone Encounter (Signed)
error 

## 2015-10-14 ENCOUNTER — Ambulatory Visit
Admission: RE | Admit: 2015-10-14 | Discharge: 2015-10-14 | Disposition: A | Discharge status: Home / Self Care | Payer: Managed Care, Other (non HMO) | Source: Ambulatory Visit | Attending: Family Medicine | Admitting: Family Medicine

## 2015-10-14 DIAGNOSIS — R1013 Epigastric pain: Secondary | ICD-10-CM | POA: Diagnosis not present

## 2015-10-15 ENCOUNTER — Ambulatory Visit
Admission: RE | Admit: 2015-10-15 | Discharge: 2015-10-15 | Disposition: A | Discharge status: Home / Self Care | Payer: Managed Care, Other (non HMO) | Source: Ambulatory Visit | Attending: Family Medicine | Admitting: Family Medicine

## 2015-10-15 ENCOUNTER — Ambulatory Visit (INDEPENDENT_AMBULATORY_CARE_PROVIDER_SITE_OTHER): Payer: Managed Care, Other (non HMO) | Admitting: Family Medicine

## 2015-10-15 VITALS — BP 118/64 | HR 80 | Temp 98.3°F | Resp 12 | Wt 117.0 lb

## 2015-10-15 DIAGNOSIS — Z90710 Acquired absence of both cervix and uterus: Secondary | ICD-10-CM | POA: Insufficient documentation

## 2015-10-15 DIAGNOSIS — K5790 Diverticulosis of intestine, part unspecified, without perforation or abscess without bleeding: Secondary | ICD-10-CM | POA: Insufficient documentation

## 2015-10-15 DIAGNOSIS — R634 Abnormal weight loss: Secondary | ICD-10-CM | POA: Insufficient documentation

## 2015-10-15 DIAGNOSIS — R197 Diarrhea, unspecified: Secondary | ICD-10-CM

## 2015-10-15 DIAGNOSIS — R1013 Epigastric pain: Secondary | ICD-10-CM

## 2015-10-15 DIAGNOSIS — I2510 Atherosclerotic heart disease of native coronary artery without angina pectoris: Secondary | ICD-10-CM | POA: Insufficient documentation

## 2015-10-15 HISTORY — DX: Unspecified asthma, uncomplicated: J45.909

## 2015-10-15 MED ORDER — IOPAMIDOL (ISOVUE-300) INJECTION 61%
100.0000 mL | Freq: Once | INTRAVENOUS | Status: AC | PRN
  Administered 2015-10-15: 100 mL via INTRAVENOUS

## 2015-10-15 MED ORDER — DOXYCYCLINE HYCLATE 100 MG PO TABS: 100.0000 mg | ORAL_TABLET | Freq: Two times a day (BID) | ORAL | 0 refills | Status: DC

## 2015-10-15 NOTE — Progress Notes (Signed)
Subjective:  HPI  Patient is here for 10 day follow up on epigastric pain and diarrhea. She is still having diarrhea and it is now waking her up at night. Diarrhea lasts 1 and 1/2 hours daily. She has cut out dairy, eggs. She had to leave work today because of feeling so bad. She is taking Omeprazole and Metamucil and symptoms have not changed.  We did [place] and labs all were normal results. Wt Readings from Last 3 Encounters:  10/15/15 117 lb (53.1 kg)  10/05/15 118 lb (53.5 kg)  06/22/15 127 lb (57.6 kg)   Prior to Admission medications   Medication Sig Start Date End Date Taking? Authorizing Provider  fluticasone (FLONASE) 50 MCG/ACT nasal spray  11/14/14  Yes Historical Provider, MD  Fluticasone-Salmeterol (ADVAIR DISKUS) 250-50 MCG/DOSE AEPB Inhale 1 puff into the lungs daily. 03/31/15  Yes Richard Maceo Pro., MD  omeprazole (PRILOSEC) 20 MG capsule Take 1 capsule (20 mg total) by mouth 2 (two) times daily before a meal. 10/05/15  Yes Jerrol Banana., MD  PROAIR HFA 108 [phone] Base) MCG/ACT inhaler TAKE 2 PUFFS AS NEEDED BEFORE EXERCISE 03/26/15  Yes Richard Maceo Pro., MD  sertraline (ZOLOFT) 50 MG tablet TAKE ONE TABLET BY MOUTH EVERY DAY 08/25/15  Yes Jerrol Banana., MD    Patient Active Problem List   Diagnosis Date Noted  . Anxiety 02/10/2015  . Osteopenia 02/10/2015  . 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 Nonspecific abnormal finding in stool contents 02/10/2015  . 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 Avitaminosis D 09/02/2007  . Acid reflux 04/01/2007  . 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 Clinical depression 10/20/2006  . 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 Hypercholesterolemia without hypertriglyceridemia 10/20/2006    No past medical history on file.  Social History   Social History  . Marital status: Married    Spouse name: N/A  . Number of children: N/A  . Years of education: N/A   Occupational History  . Not on  file.   Social History Main Topics  . Smoking status: Former Smoker    Quit date: 02/27/2006  . Smokeless tobacco: Never Used  . Alcohol use 0.0 oz/week     Comment: Moderate use  . Drug use: No  . Sexual activity: Not on file   Other Topics Concern  . Not on file   Social History Narrative  . No narrative on file    Allergies  Allergen Reactions  . Erythromycin     vomiting  . Latex     Review of Systems  Constitutional: Positive for malaise/fatigue and weight loss.       Decreased appetitie  HENT: Negative.   Eyes: Negative.   Respiratory: Positive for shortness of breath (from feeling weak.).   Cardiovascular: Negative.   Gastrointestinal: Positive for abdominal pain, diarrhea and nausea.       Rectal pain from diarrhea  Musculoskeletal: Positive for myalgias.  Skin: Negative.   Neurological: Positive for dizziness and weakness.  Endo/Heme/Allergies: Negative.   Psychiatric/Behavioral: Negative.     Immunization History  Administered Date(s) Administered  . Influenza-Unspecified 11/25/2014  . Pneumococcal Polysaccharide-23 10/15/2012  . Tdap 04/16/2012  . Zoster 12/28/2012   Objective:  BP 118/64   Pulse 80   Temp 98.3 F (36.8 C)   Resp 12   Wt 117 lb (53.1 kg)   BMI 22.11 kg/m   Physical Exam  Constitutional: She is oriented to person,  place, and time and well-developed, well-nourished, and in no distress. Vital signs are normal. She has a sickly appearance.  Thin white female who obviously does not appear well today. She is in no acute distress. Skin turgor is okay. Mucous membranes are moist.  HENT:  Head: Normocephalic and atraumatic.  Right Ear: External ear normal.  Left Ear: External ear normal.  Nose: Nose normal.  Mouth/Throat: Oropharynx is clear and moist.  Eyes: Conjunctivae are normal. Pupils are equal, round, and reactive to light.  Neck: Normal range of motion. Neck supple.  Cardiovascular: Normal rate, regular rhythm, normal  heart sounds and intact distal pulses.   No murmur heard. Pulmonary/Chest: Effort normal and breath sounds normal. No respiratory distress. She has no wheezes.  Abdominal: She exhibits no distension and no mass. There is tenderness (RLQ tenderness and mild LLQ and epigastric tenderness is the most severe area ). There is no rebound and no guarding.  Mildly hyperactive bowel sounds.  Musculoskeletal: She exhibits no edema.  Neurological: She is alert and oriented to person, place, and time.  Skin: Skin is warm and dry.  Psychiatric: Mood, memory, affect and judgment normal.    Lab Results  Component Value Date   WBC 5.0 10/05/2015   HGB 13.3 07/03/2014   HCT 45.5 10/05/2015   PLT 243 10/05/2015   GLUCOSE 99 10/05/2015   CHOL 241 (H) 07/23/2015   TRIG 71 07/23/2015   HDL 93 07/23/2015   LDLCALC 134 (H) 07/23/2015   TSH 1.720 07/23/2015    CMP     Component Value Date/Time   NA 137 10/05/2015 1416   K 4.4 10/05/2015 1416   CL 97 10/05/2015 1416   CO2 18 10/05/2015 1416   GLUCOSE 99 10/05/2015 1416   BUN 21 10/05/2015 1416   CREATININE 0.92 10/05/2015 1416   CALCIUM 9.8 10/05/2015 1416   PROT 8.3 10/05/2015 1416   ALBUMIN 4.9 10/05/2015 1416   AST 32 10/05/2015 1416   ALT 20 10/05/2015 1416   ALKPHOS 100 10/05/2015 1416   BILITOT 0.3 10/05/2015 1416   GFRNONAA 69 10/05/2015 1416   GFRAA 80 10/05/2015 1416    Assessment and Plan :  1. Epigastric pain Unchanged. Labs and [place] were normal.  - Ambulatory referral to Gastroenterology - CT Abdomen Pelvis W Contrast; Future More than 50% of 25 minute visit spent in counseling in regards regarding issues and work up and potential treatment. 2. Diarrhea, unspecified type/colitis/Treat as diverticulitis Presumed diverticulitis and will treat as such pending CT results and appointment with GI. Does not fit as a gallbladder issue. - Ambulatory referral to Gastroenterology - CT Abdomen Pelvis W Contrast; Future  3. Weight loss,  unintentional/fatigue - Ambulatory referral to Gastroenterology - CT Abdomen Pelvis W Contrast; Future She does not appear to be dehydrated today but clinically I'm very concerned about the weight loss of 10 pounds. Patient was seen and examined by Dr. Eulas Post and note was scribed by Theressa Millard, RMA.   Miguel Aschoff MD [facility] 10/15/2015 3:57 PM

## 2015-10-20 ENCOUNTER — Other Ambulatory Visit: Payer: Self-pay | Admitting: Gastroenterology

## 2015-10-20 DIAGNOSIS — R634 Abnormal weight loss: Secondary | ICD-10-CM

## 2015-10-20 DIAGNOSIS — R197 Diarrhea, unspecified: Secondary | ICD-10-CM

## 2015-10-20 DIAGNOSIS — R1084 Generalized abdominal pain: Secondary | ICD-10-CM

## 2015-10-21 ENCOUNTER — Other Ambulatory Visit
Admission: RE | Admit: 2015-10-21 | Discharge: 2015-10-21 | Disposition: A | Discharge status: Home / Self Care | Payer: Managed Care, Other (non HMO) | Source: Ambulatory Visit | Attending: Gastroenterology | Admitting: Gastroenterology

## 2015-10-21 DIAGNOSIS — R1084 Generalized abdominal pain: Secondary | ICD-10-CM | POA: Diagnosis present

## 2015-10-21 DIAGNOSIS — R634 Abnormal weight loss: Secondary | ICD-10-CM | POA: Diagnosis present

## 2015-10-21 DIAGNOSIS — R197 Diarrhea, unspecified: Secondary | ICD-10-CM | POA: Diagnosis not present

## 2015-10-21 LAB — GASTROINTESTINAL PANEL BY PCR, STOOL (REPLACES STOOL CULTURE)
Adenovirus F40/41: NOT DETECTED
Astrovirus: NOT DETECTED
CAMPYLOBACTER SPECIES: NOT DETECTED
CRYPTOSPORIDIUM: NOT DETECTED
CYCLOSPORA CAYETANENSIS: DETECTED — AB
ENTEROTOXIGENIC E COLI (ETEC): NOT DETECTED
Entamoeba histolytica: NOT DETECTED
Enteroaggregative E coli (EAEC): NOT DETECTED
Enteropathogenic E coli (EPEC): NOT DETECTED
Giardia lamblia: NOT DETECTED
Norovirus GI/GII: NOT DETECTED
PLESIMONAS SHIGELLOIDES: NOT DETECTED
ROTAVIRUS A: NOT DETECTED
SAPOVIRUS (I, II, IV, AND V): NOT DETECTED
SHIGA LIKE TOXIN PRODUCING E COLI (STEC): NOT DETECTED
Salmonella species: NOT DETECTED
Shigella/Enteroinvasive E coli (EIEC): NOT DETECTED
VIBRIO SPECIES: NOT DETECTED
Vibrio cholerae: NOT DETECTED
YERSINIA ENTEROCOLITICA: NOT DETECTED

## 2015-10-21 LAB — C DIFFICILE QUICK SCREEN W PCR REFLEX
C DIFFICILE (CDIFF) INTERP: NOT DETECTED
C Diff antigen: NEGATIVE
C Diff toxin: NEGATIVE

## 2015-10-29 ENCOUNTER — Encounter: Admission: RE | Admit: 2015-10-29 | Payer: Managed Care, Other (non HMO) | Source: Ambulatory Visit

## 2015-10-29 ENCOUNTER — Ambulatory Visit: Payer: Managed Care, Other (non HMO) | Admitting: Family Medicine

## 2015-12-22 ENCOUNTER — Ambulatory Visit (INDEPENDENT_AMBULATORY_CARE_PROVIDER_SITE_OTHER): Payer: Managed Care, Other (non HMO) | Admitting: Family Medicine

## 2015-12-22 ENCOUNTER — Encounter: Payer: Self-pay | Admitting: Family Medicine

## 2015-12-22 VITALS — BP 122/64 | HR 62 | Temp 98.4°F | Resp 12 | Wt 118.0 lb

## 2015-12-22 DIAGNOSIS — R634 Abnormal weight loss: Secondary | ICD-10-CM

## 2015-12-22 DIAGNOSIS — R197 Diarrhea, unspecified: Secondary | ICD-10-CM | POA: Diagnosis not present

## 2015-12-22 DIAGNOSIS — J4520 Mild intermittent asthma, uncomplicated: Secondary | ICD-10-CM

## 2015-12-22 DIAGNOSIS — F3289 Other specified depressive episodes: Secondary | ICD-10-CM

## 2015-12-22 NOTE — Progress Notes (Signed)
Kristen Sandoval  MRN: [id number] DOB: 08-28-1955  Subjective:  HPI  Patient is her for follow up.  Last visit was in August for acute visits for epigastric pain and was treated presumed diverticulosis. She ended up going to GI and was found to have parasite and was treated with antibiotic and symptoms are resolved now. She is gradually gaining some weight back. Wt Readings from Last 3 Encounters:  12/22/15 118 lb (53.5 kg)  10/15/15 117 lb (53.1 kg)  10/05/15 118 lb (53.5 kg)    Depression: patient states she is doing ok. Takes Sertraline daily.  Last labs routine were done in May and then in August when she was sick. Patient Active Problem List   Diagnosis Date Noted  . Anxiety 02/10/2015  . Osteopenia 02/10/2015  . 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 Nonspecific abnormal finding in stool contents 02/10/2015  . 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 Avitaminosis D 09/02/2007  . Acid reflux 04/01/2007  . 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 Clinical depression 10/20/2006  . 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 Hypercholesterolemia without hypertriglyceridemia 10/20/2006    Past Medical History:  Diagnosis Date  . Asthma     Social History   Social History  . Marital status: Married    Spouse name: N/A  . Number of children: N/A  . Years of education: N/A   Occupational History  . Not on file.   Social History Main Topics  . Smoking status: Former Smoker    Quit date: 02/27/2006  . Smokeless tobacco: Never Used  . Alcohol use 0.0 oz/week     Comment: Moderate use  . Drug use: No  . Sexual activity: Not on file   Other Topics Concern  . Not on file   Social History Narrative  . No narrative on file    Outpatient Encounter Prescriptions as of 12/22/2015  Medication Sig Note  . fluticasone (FLONASE) 50 MCG/ACT nasal spray  12/22/2015: As needed  . Fluticasone-Salmeterol (ADVAIR DISKUS) 250-50 MCG/DOSE AEPB Inhale 1 puff into the  lungs daily.   Marland Kitchen PROAIR HFA 108 (90 Base) MCG/ACT inhaler TAKE 2 PUFFS AS NEEDED BEFORE EXERCISE   . sertraline (ZOLOFT) 50 MG tablet TAKE ONE TABLET BY MOUTH EVERY DAY   . [DISCONTINUED] doxycycline (VIBRA-TABS) 100 MG tablet Take 1 tablet (100 mg total) by mouth 2 (two) times daily.   . [DISCONTINUED] omeprazole (PRILOSEC) 20 MG capsule Take 1 capsule (20 mg total) by mouth 2 (two) times daily before a meal.    No facility-administered encounter medications on file as of 12/22/2015.     Allergies  Allergen Reactions  . Clarithromycin Itching  . Erythromycin     vomiting  . Latex     Review of Systems  Constitutional: Negative.   Respiratory: Negative.   Cardiovascular: Negative.   Musculoskeletal: Positive for joint pain (knees at times).  Psychiatric/Behavioral: Negative.    Objective:  BP 122/64   Pulse 62   Temp 98.4 F (36.9 C)   Resp 12   Wt 118 lb (53.5 kg)   BMI 22.30 kg/m   Physical Exam  Constitutional: She is oriented to person, place, and time and well-developed, well-nourished, and in no distress.  Eyes: Conjunctivae are normal. Pupils are equal, round, and reactive to light.  Neck: Normal range of motion. Neck supple.  Cardiovascular: Normal rate, regular rhythm, normal heart sounds and intact distal pulses.   No murmur heard. Pulmonary/Chest: Effort normal and breath sounds  normal. No respiratory distress. She has no wheezes.  Abdominal: Soft.  Musculoskeletal: She exhibits no edema or tenderness.  Neurological: She is alert and oriented to person, place, and time.    Assessment and Plan :  1. Diarrhea, unspecified type Resolved. She was treated for parasite infection.  2. Weight loss, unintentional Improving.  3. Other depression Stable.Possible lifelong sertraline. We'll discuss this treatment next spring  4. Mild intermittent asthma without complication Stable.  HPI, Exam and A&P transcribed under direction and in the presence of Miguel Aschoff, MD. I have done the exam and reviewed the chart and it is accurate to the best of my knowledge. Miguel Aschoff M.D. [facility]

## 2016-02-11 ENCOUNTER — Ambulatory Visit (INDEPENDENT_AMBULATORY_CARE_PROVIDER_SITE_OTHER): Payer: 59 | Admitting: Orthopedic Surgery

## 2016-02-11 ENCOUNTER — Encounter (INDEPENDENT_AMBULATORY_CARE_PROVIDER_SITE_OTHER): Payer: Self-pay

## 2016-02-11 ENCOUNTER — Encounter (INDEPENDENT_AMBULATORY_CARE_PROVIDER_SITE_OTHER): Payer: Self-pay | Admitting: Orthopedic Surgery

## 2016-02-11 DIAGNOSIS — M25562 Pain in left knee: Secondary | ICD-10-CM | POA: Diagnosis not present

## 2016-02-11 DIAGNOSIS — M25462 Effusion, left knee: Secondary | ICD-10-CM | POA: Diagnosis not present

## 2016-02-11 MED ORDER — BUPIVACAINE HCL 0.25 % IJ SOLN
4.0000 mL | INTRAMUSCULAR | Status: AC | PRN
  Administered 2016-02-11: 4 mL via INTRA_ARTICULAR

## 2016-02-11 MED ORDER — METHYLPREDNISOLONE ACETATE 40 MG/ML IJ SUSP
40.0000 mg | INTRAMUSCULAR | Status: AC | PRN
  Administered 2016-02-11: 40 mg via INTRA_ARTICULAR

## 2016-02-11 MED ORDER — LIDOCAINE HCL 1 % IJ SOLN
5.0000 mL | INTRAMUSCULAR | Status: AC | PRN
  Administered 2016-02-11: 5 mL

## 2016-02-11 NOTE — Progress Notes (Signed)
Office Visit Note   Patient: Kristen Sandoval           Date of Birth: 13-Apr-1955           MRN: [id number] Visit Date: 02/11/2016 Requested by: Jerrol Banana., MD [address] [place], [place] [address] PCP: Wilhemena Durie, MD  Subjective: Chief Complaint  Patient presents with  . Left Knee - Pain, Edema    HPI Iline is a 60 year old patient with left knee pain and swelling.  She's been working out a lot.  Her weight down from 150-118.  She states that the left knee has been giving out and she's been wrapping it.  Takes occasional ibuprofen.  Has cortisone injection in February which helped.  Right knee is doing well.  Has a history of left knee patellar realignment surgery 3.              Review of Systems All systems reviewed are negative as they relate to the chief complaint within the history of present illness.  Patient denies  fevers or chills.    Assessment & Plan: Visit Diagnoses:  1. Swelling of left knee joint   2. 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  Plan is aspiration and injection which is done today.  Tolerated the procedure well.  Continue with exercising in a moderated fashion follow-up with me as needed  Follow-Up Instructions: No Follow-up on file.   Orders:  No orders of the defined types were placed in this encounter.  No orders of the defined types were placed in this encounter.     Procedures: Large Joint Inj Date/Time: 02/11/2016 4:45 PM Performed by: Meredith Pel Authorized by: Meredith Pel   Consent Given by:  Patient Site marked: the procedure site was marked   Timeout: prior to procedure the correct patient, procedure, and site was verified   Indications:  Pain, joint swelling and diagnostic evaluation Location:  Knee Site:  L knee Prep: patient was prepped and draped in usual sterile fashion   Needle Size:  18 G Needle  Length:  1.5 inches Approach:  Superolateral Ultrasound Guidance: No   Fluoroscopic Guidance: No   Arthrogram: No   Medications:  5 mL lidocaine 1 %; 4 mL bupivacaine 0.25 %; 40 mg methylPREDNISolone acetate 40 MG/ML Aspiration Attempted: Yes   Aspirate:  Serous Patient tolerance:  Patient tolerated the procedure well with no immediate complications     Clinical Data: No additional findings.  Objective: Vital Signs: There were no vitals taken for this visit.  Physical Exam   Constitutional: Patient appears well-developed HEENT:  Head: Normocephalic Eyes:EOM are normal Neck: Normal range of motion Cardiovascular: Normal rate Pulmonary/chest: Effort normal Neurologic: Patient is alert Skin: Skin is warm Psychiatric: Patient has normal mood and affect    Ortho Exam examination of the left knee demonstrates full range of motion well-healed surgical incision below the tibial tubercle good patellar mobility but without apprehension or instability collateral cruciates are stable extensor mechanism is intact  Specialty Comments:  No specialty comments available.  Imaging: No results found.   PMFS History: Patient Active Problem List   Diagnosis Date Noted  . Anxiety 02/10/2015  . Osteopenia 02/10/2015  . 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 Nonspecific abnormal finding in stool contents 02/10/2015  . 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 Avitaminosis D 09/02/2007  . Acid reflux  04/01/2007  . 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 Clinical depression 10/20/2006  . 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 Hypercholesterolemia without hypertriglyceridemia 10/20/2006   Past Medical History:  Diagnosis Date  . Asthma     Family History  Problem Relation Age of Onset  . Adopted: Yes  . Lung cancer Mother     Past Surgical History:  Procedure Laterality Date  . ABDOMINAL HYSTERECTOMY  1990's  . KNEE SURGERY Left 09/2009  . OOPHORECTOMY Bilateral  1990's   Social History   Occupational History  . Not on file.   Social History Main Topics  . Smoking status: Former Smoker    Quit date: 02/27/2006  . Smokeless tobacco: Never Used  . Alcohol use 0.0 oz/week     Comment: Moderate use  . Drug use: No  . Sexual activity: Not on file

## 2016-03-09 DIAGNOSIS — H5711 Ocular pain, right eye: Secondary | ICD-10-CM | POA: Diagnosis not present

## 2016-05-09 DIAGNOSIS — J028 Acute pharyngitis due to other specified organisms: Secondary | ICD-10-CM | POA: Diagnosis not present

## 2016-05-09 DIAGNOSIS — R070 Pain in throat: Secondary | ICD-10-CM | POA: Diagnosis not present

## 2016-05-25 ENCOUNTER — Other Ambulatory Visit: Payer: Self-pay | Admitting: Family Medicine

## 2016-06-21 ENCOUNTER — Ambulatory Visit: Payer: Managed Care, Other (non HMO) | Admitting: Family Medicine

## 2016-08-08 ENCOUNTER — Encounter: Payer: Self-pay | Admitting: Family

## 2016-08-08 ENCOUNTER — Ambulatory Visit (INDEPENDENT_AMBULATORY_CARE_PROVIDER_SITE_OTHER): Payer: 59 | Admitting: Family

## 2016-08-08 VITALS — BP 142/76 | HR 78 | Temp 98.1°F | Ht 61.0 in | Wt 120.6 lb

## 2016-08-08 DIAGNOSIS — J4520 Mild intermittent asthma, uncomplicated: Secondary | ICD-10-CM

## 2016-08-08 DIAGNOSIS — F3289 Other specified depressive episodes: Secondary | ICD-10-CM

## 2016-08-08 MED ORDER — ALBUTEROL SULFATE HFA 108 (90 BASE) MCG/ACT IN AERS: 2.0000 | INHALATION_SPRAY | Freq: Four times a day (QID) | RESPIRATORY_TRACT | 2 refills | Status: DC | PRN

## 2016-08-08 MED ORDER — SERTRALINE HCL 100 MG PO TABS: 100.0000 mg | ORAL_TABLET | Freq: Every day | ORAL | 1 refills | Status: DC

## 2016-08-08 NOTE — Assessment & Plan Note (Signed)
Trial of zoloft AT bedtime ( she has been taking during the day) . Follow up 8 weeks.

## 2016-08-08 NOTE — Progress Notes (Signed)
Pre visit review using our clinic review tool, if applicable. No additional management support is needed unless otherwise documented below in the visit note. 

## 2016-08-08 NOTE — Assessment & Plan Note (Signed)
Well-controlled on current regimen. Discussed with patient her former smoking history and how likely chest x-ray last year reflecting COPD is due to former smoking history. She politely declines any pulmonology referral for formal diagnosis of asthma this time. Symptoms well controlled. Refilled medications and we'll continue to follow.

## 2016-08-08 NOTE — Progress Notes (Signed)
Subjective:    Patient ID: Kristen Sandoval, female    DOB: [birth year], 61 y.o.   MRN: [id number]  CC: Kristen Sandoval is a 61 y.o. female who presents today to establish care.    HPI: From [facility]- Dr Rosanna Randy.   Airway hyperreactivity- wants to change proair to ventolin. Doing Advair once per day. No wheezing, cough. Triggered by cold weather, bronchitis. Former smoker decades ago. Doesn't see reason to see pulmonology. Told last year with PNA she  May have copd.   Depression and anxiety- on zoloft, takes in the morning; couple of years; working some but 'some days not so well.' No thoughts of hurting herself or anyone else. Does wake up in the night at a2-3 am and trouble going back to sleep.       HISTORY:  Past Medical History:  Diagnosis Date  . Allergy   . Anxiety   . Arthritis   . Asthma   . GERD (gastroesophageal reflux disease)    Past Surgical History:  Procedure Laterality Date  . ABDOMINAL HYSTERECTOMY  1990's  . KNEE SURGERY Left 09/2009  . OOPHORECTOMY Bilateral 1990's  . TONSILLECTOMY AND ADENOIDECTOMY     Family History  Problem Relation Age of Onset  . Adopted: Yes  . Lung cancer Mother     Allergies: Clarithromycin; Erythromycin; and Latex Current Outpatient Prescriptions on File Prior to Visit  Medication Sig Dispense Refill  . ADVAIR DISKUS 250-50 MCG/DOSE AEPB INHALE 1 PUFF INTO THE LUNGS DAILY *RINSE MOUTH AFTER USE* 60 each 11  . fluticasone (FLONASE) 50 MCG/ACT nasal spray     . PROAIR HFA 108 (90 Base) MCG/ACT inhaler TAKE 2 PUFFS AS NEEDED BEFORE EXERCISE (Patient not taking: Reported on 08/08/2016) 18 g 12   No current facility-administered medications on file prior to visit.     Social History  Substance Use Topics  . Smoking status: Former Smoker    Quit date: 02/27/2006  . Smokeless tobacco: Never Used  . Alcohol use 0.0 oz/week     Comment: Moderate use    Review of Systems  Constitutional: Negative for chills and fever.    Respiratory: Negative for cough, shortness of breath and wheezing.   Cardiovascular: Negative for chest pain and palpitations.  Gastrointestinal: Negative for nausea and vomiting.  Psychiatric/Behavioral: Positive for sleep disturbance. Negative for suicidal ideas.      Objective:    BP (!) 142/76   Pulse 78   Temp 98.1 F (36.7 C) (Oral)   Ht 5\' 1"  (1.549 m)   Wt 120 lb 9.6 oz (54.7 kg)   SpO2 98%   BMI 22.79 kg/m  BP Readings from Last 3 Encounters:  08/08/16 (!) 142/76  12/22/15 122/64  10/15/15 118/64   Wt Readings from Last 3 Encounters:  08/08/16 120 lb 9.6 oz (54.7 kg)  12/22/15 118 lb (53.5 kg)  10/15/15 117 lb (53.1 kg)    Physical Exam  Constitutional: She appears well-developed and well-nourished.  Eyes: Conjunctivae are normal.  Cardiovascular: Normal rate, regular rhythm, normal heart sounds and normal pulses.   Pulmonary/Chest: Effort normal and breath sounds normal. She has no wheezes. She has no rhonchi. She has no rales.  Neurological: She is alert.  Skin: Skin is warm and dry.  Psychiatric: She has a normal mood and affect. Her speech is normal and behavior is normal. Thought content normal.  Vitals reviewed.      Assessment & Plan:   Problem List Items Addressed This  Visit      Respiratory   Airway hyperreactivity    Well-controlled on current regimen. Discussed with patient her former smoking history and how likely chest x-ray last year reflecting COPD is due to former smoking history. She politely declines any pulmonology referral for formal diagnosis of asthma this time. Symptoms well controlled. Refilled medications and we'll continue to follow.      Relevant Medications   albuterol (VENTOLIN HFA) 108 (90 Base) MCG/ACT inhaler     Other   Anxiety and depression - Primary    Trial of zoloft AT bedtime ( she has been taking during the day) . Follow up 8 weeks.       Relevant Medications   sertraline (ZOLOFT) 100 MG tablet       I  have discontinued Ms. Dupre's sertraline. I am also having her start on sertraline. Additionally, I am having her maintain her fluticasone, PROAIR HFA, ADVAIR DISKUS, and albuterol.   Meds ordered this encounter  Medications  . sertraline (ZOLOFT) 100 MG tablet    Sig: Take 1 tablet (100 mg total) by mouth daily.    Dispense:  90 tablet    Refill:  1    Order Specific Question:   Supervising Provider    Answer:   Deborra Medina L [[id number]]  . DISCONTD: albuterol (VENTOLIN HFA) 108 (90 Base) MCG/ACT inhaler    Sig: Inhale 2 puffs into the lungs every 6 (six) hours as needed for wheezing or shortness of breath.  Marland Kitchen albuterol (VENTOLIN HFA) 108 (90 Base) MCG/ACT inhaler    Sig: Inhale 2 puffs into the lungs every 6 (six) hours as needed for wheezing or shortness of breath.    Dispense:  18 g    Refill:  2    Return precautions given.   Risks, benefits, and alternatives of the medications and treatment plan prescribed today were discussed, and patient expressed understanding.   Education regarding symptom management and diagnosis given to patient on AVS.  Continue to follow with Burnard Hawthorne, FNP for routine health maintenance.   Bubba Camp and I agreed with plan.   Mable Paris, FNP

## 2016-08-08 NOTE — Patient Instructions (Addendum)
Pleasure meeting you   Increase zoloft- take at bedtime   Return in 8 - 12 weeks for physical

## 2016-10-18 ENCOUNTER — Ambulatory Visit (INDEPENDENT_AMBULATORY_CARE_PROVIDER_SITE_OTHER): Payer: 59 | Admitting: Family

## 2016-10-18 ENCOUNTER — Other Ambulatory Visit (HOSPITAL_COMMUNITY)
Admission: RE | Admit: 2016-10-18 | Discharge: 2016-10-18 | Disposition: A | Discharge status: Home / Self Care | Payer: 59 | Source: Ambulatory Visit | Attending: Family | Admitting: Family

## 2016-10-18 ENCOUNTER — Encounter: Payer: Self-pay | Admitting: Family

## 2016-10-18 VITALS — BP 136/72 | HR 78 | Temp 98.6°F | Resp 12 | Ht 61.0 in | Wt 119.4 lb

## 2016-10-18 DIAGNOSIS — Z0000 Encounter for general adult medical examination without abnormal findings: Secondary | ICD-10-CM

## 2016-10-18 DIAGNOSIS — Z1231 Encounter for screening mammogram for malignant neoplasm of breast: Secondary | ICD-10-CM | POA: Diagnosis not present

## 2016-10-18 DIAGNOSIS — Z1239 Encounter for other screening for malignant neoplasm of breast: Secondary | ICD-10-CM

## 2016-10-18 DIAGNOSIS — J4520 Mild intermittent asthma, uncomplicated: Secondary | ICD-10-CM | POA: Diagnosis not present

## 2016-10-18 DIAGNOSIS — Z01818 Encounter for other preprocedural examination: Secondary | ICD-10-CM | POA: Insufficient documentation

## 2016-10-18 LAB — CBC WITH DIFFERENTIAL/PLATELET
BASOS PCT: 0.8 % (ref 0.0–3.0)
Basophils Absolute: 0 10*3/uL (ref 0.0–0.1)
Eosinophils Absolute: 0.1 10*3/uL (ref 0.0–0.7)
Eosinophils Relative: 1.8 % (ref 0.0–5.0)
HCT: 41.9 % (ref 36.0–46.0)
Hemoglobin: 13.9 g/dL (ref 12.0–15.0)
Lymphocytes Relative: 35.2 % (ref 12.0–46.0)
Lymphs Abs: 1.5 10*3/uL (ref 0.7–4.0)
MCHC: 33.3 g/dL (ref 30.0–36.0)
MCV: 94.3 fl (ref 78.0–100.0)
Monocytes Absolute: 0.3 10*3/uL (ref 0.1–1.0)
Monocytes Relative: 6.1 % (ref 3.0–12.0)
Neutro Abs: 2.4 10*3/uL (ref 1.4–7.7)
Neutrophils Relative %: 56.1 % (ref 43.0–77.0)
PLATELETS: 243 10*3/uL (ref 150.0–400.0)
RBC: 4.44 Mil/uL (ref 3.87–5.11)
RDW: 13 % (ref 11.5–15.5)
WBC: 4.2 10*3/uL (ref 4.0–10.5)

## 2016-10-18 LAB — COMPREHENSIVE METABOLIC PANEL
ALK PHOS: 78 U/L (ref 39–117)
ALT: 17 U/L (ref 0–35)
AST: 24 U/L (ref 0–37)
Albumin: 4.2 g/dL (ref 3.5–5.2)
BUN: 13 mg/dL (ref 6–23)
CO2: 28 meq/L (ref 19–32)
Calcium: 9.6 mg/dL (ref 8.4–10.5)
Chloride: 104 mEq/L (ref 96–112)
Creatinine, Ser: 0.66 mg/dL (ref 0.40–1.20)
GFR: 97.46 mL/min (ref >60.00)
GLUCOSE: 97 mg/dL (ref 70–99)
POTASSIUM: 4.4 meq/L (ref 3.5–5.1)
Sodium: 139 mEq/L (ref 135–145)
TOTAL PROTEIN: 7.8 g/dL (ref 6.0–8.3)
Total Bilirubin: 0.7 mg/dL (ref 0.2–1.2)

## 2016-10-18 LAB — LIPID PANEL
CHOL/HDL RATIO: 3
Cholesterol: 260 mg/dL — ABNORMAL HIGH (ref 0–200)
HDL: 99.8 mg/dL (ref >39.00)
LDL Cholesterol: 143 mg/dL — ABNORMAL HIGH (ref 0–99)
NONHDL: 160.48
Triglycerides: 85 mg/dL (ref 0.0–149.0)
VLDL: 17 mg/dL (ref 0.0–40.0)

## 2016-10-18 LAB — HEMOGLOBIN A1C: HEMOGLOBIN A1C: 5.7 % (ref 4.6–6.5)

## 2016-10-18 LAB — VITAMIN D 25 HYDROXY (VIT D DEFICIENCY, FRACTURES): VITD: 32.43 ng/mL (ref 30.00–100.00)

## 2016-10-18 LAB — TSH: TSH: 1.31 u[IU]/mL (ref 0.35–4.50)

## 2016-10-18 LAB — HIV ANTIBODY (ROUTINE TESTING W REFLEX): HIV: NONREACTIVE

## 2016-10-18 MED ORDER — FLUTICASONE-SALMETEROL 100-50 MCG/DOSE IN AEPB: 1.0000 | INHALATION_SPRAY | Freq: Every day | RESPIRATORY_TRACT | 3 refills | Status: DC

## 2016-10-18 MED ORDER — FLUTICASONE-SALMETEROL 100-50 MCG/DOSE IN AEPB: 1.0000 | INHALATION_SPRAY | Freq: Two times a day (BID) | RESPIRATORY_TRACT | 3 refills | Status: DC

## 2016-10-18 NOTE — Progress Notes (Signed)
Pre-visit discussion using our clinic review tool. No additional management support is needed unless otherwise documented below in the visit note.  

## 2016-10-18 NOTE — Patient Instructions (Addendum)
Please bring colonoscopy report from Dr Candace Cruise- no reports in our system  Labs today  We placed a referral for mammogram this year. I asked that you call one the below locations and schedule this when it is convenient for you.   As discussed, I would like you to ask for 3D mammogram over the traditional 2D mammogram as new evidence suggest 3D is superior.   Please note that NOT all insurance companies cover 3D and you may have to pay a higher copay. You may call your insurance company to further clarify your benefits.   Options for [place]  [place], [place]  * [facility] [place] [place] * Note if you ask for 3D mammogram at this location, you must request [place], [place] location*   Health Maintenance, Female Adopting a healthy lifestyle and getting preventive care can go a long way to promote health and wellness. Talk with your health care provider about what schedule of regular examinations is right for you. This is a good chance for you to check in with your provider about disease prevention and staying healthy. In between checkups, there are plenty of things you can do on your own. Experts have done a lot of research about which lifestyle changes and preventive measures are most likely to keep you healthy. Ask your health care provider for more information. Weight and diet Eat a healthy diet  Be sure to include plenty of vegetables, fruits, low-fat dairy products, and lean protein.  Do not eat a lot of foods high in solid fats, added sugars, or salt.  Get regular exercise. This is one of the most important things you can do for your health. ? Most adults should exercise for at least 150 minutes each week. The exercise should increase your heart rate and make you sweat (moderate-intensity exercise). ? Most adults should also do  strengthening exercises at least twice a week. This is in addition to the moderate-intensity exercise.  Maintain a healthy weight  Body mass index (BMI) is a measurement that can be used to identify possible weight problems. It estimates body fat based on height and weight. Your health care provider can help determine your BMI and help you achieve or maintain a healthy weight.  For females 31 years of age and older: ? A BMI below 18.5 is considered underweight. ? A BMI of 18.5 to 24.9 is normal. ? A BMI of 25 to 29.9 is considered overweight. ? A BMI of 30 and above is considered obese.  Watch levels of cholesterol and blood lipids  You should start having your blood tested for lipids and cholesterol at 59 years of age, then have this test every 5 years.  You may need to have your cholesterol levels checked more often if: ? Your lipid or cholesterol levels are high. ? You are older than 59 years of age. ? You are at high risk for heart disease.  Cancer screening Lung Cancer  Lung cancer screening is recommended for adults 73-73 years old who are at high risk for lung cancer because of a history of smoking.  A yearly low-dose CT scan of the lungs is recommended for people who: ? Currently smoke. ? Have quit within the past 15 years. ? Have at least a 30-pack-year history of smoking. A pack year is smoking an average of one  pack of cigarettes a day for 1 year.  Yearly screening should continue until it has been 15 years since you quit.  Yearly screening should stop if you develop a health problem that would prevent you from having lung cancer treatment.  Breast Cancer  Practice breast self-awareness. This means understanding how your breasts normally appear and feel.  It also means doing regular breast self-exams. Let your health care provider know about any changes, no matter how small.  If you are in your 20s or 30s, you should have a clinical breast exam (CBE) by a health  care provider every 1-3 years as part of a regular health exam.  If you are 45 or older, have a CBE every year. Also consider having a breast X-ray (mammogram) every year.  If you have a family history of breast cancer, talk to your health care provider about genetic screening.  If you are at high risk for breast cancer, talk to your health care provider about having an MRI and a mammogram every year.  Breast cancer gene (BRCA) assessment is recommended for women who have family members with BRCA-related cancers. BRCA-related cancers include: ? Breast. ? Ovarian. ? Tubal. ? Peritoneal cancers.  Results of the assessment will determine the need for genetic counseling and BRCA1 and BRCA2 testing.  Cervical Cancer Your health care provider may recommend that you be screened regularly for cancer of the pelvic organs (ovaries, uterus, and vagina). This screening involves a pelvic examination, including checking for microscopic changes to the surface of your cervix (Pap test). You may be encouraged to have this screening done every 3 years, beginning at age 60.  For women ages 54-65, health care providers may recommend pelvic exams and Pap testing every 3 years, or they may recommend the Pap and pelvic exam, combined with testing for human papilloma virus (HPV), every 5 years. Some types of HPV increase your risk of cervical cancer. Testing for HPV may also be done on women of any age with unclear Pap test results.  Other health care providers may not recommend any screening for nonpregnant women who are considered low risk for pelvic cancer and who do not have symptoms. Ask your health care provider if a screening pelvic exam is right for you.  If you have had past treatment for cervical cancer or a condition that could lead to cancer, you need Pap tests and screening for cancer for at least 20 years after your treatment. If Pap tests have been discontinued, your risk factors (such as having a new  sexual partner) need to be reassessed to determine if screening should resume. Some women have medical problems that increase the chance of getting cervical cancer. In these cases, your health care provider may recommend more frequent screening and Pap tests.  Colorectal Cancer  This type of cancer can be detected and often prevented.  Routine colorectal cancer screening usually begins at 59 years of age and continues through 59 years of age.  Your health care provider may recommend screening at an earlier age if you have risk factors for colon cancer.  Your health care provider may also recommend using home test kits to check for hidden blood in the stool.  A small camera at the end of a tube can be used to examine your colon directly (sigmoidoscopy or colonoscopy). This is done to check for the earliest forms of colorectal cancer.  Routine screening usually begins at age 12.  Direct examination of the colon should be  repeated every 5-10 years through 59 years of age. However, you may need to be screened more often if early forms of precancerous polyps or small growths are found.  Skin Cancer  Check your skin from head to toe regularly.  Tell your health care provider about any new moles or changes in moles, especially if there is a change in a mole's shape or color.  Also tell your health care provider if you have a mole that is larger than the size of a pencil eraser.  Always use sunscreen. Apply sunscreen liberally and repeatedly throughout the day.  Protect yourself by wearing long sleeves, pants, a wide-brimmed hat, and sunglasses whenever you are outside.  Heart disease, diabetes, and high blood pressure  High blood pressure causes heart disease and increases the risk of stroke. High blood pressure is more likely to develop in: ? People who have blood pressure in the high end of the normal range (130-139/85-89 mm Hg). ? People who are overweight or obese. ? People who are  African American.  If you are 17-90 years of age, have your blood pressure checked every 3-5 years. If you are 80 years of age or older, have your blood pressure checked every year. You should have your blood pressure measured twice-once when you are at a hospital or clinic, and once when you are not at a hospital or clinic. Record the average of the two measurements. To check your blood pressure when you are not at a hospital or clinic, you can use: ? An automated blood pressure machine at a pharmacy. ? A home blood pressure monitor.  If you are between 7 years and 64 years old, ask your health care provider if you should take aspirin to prevent strokes.  Have regular diabetes screenings. This involves taking a blood sample to check your fasting blood sugar level. ? If you are at a normal weight and have a low risk for diabetes, have this test once every three years after 59 years of age. ? If you are overweight and have a high risk for diabetes, consider being tested at a younger age or more often. Preventing infection Hepatitis B  If you have a higher risk for hepatitis B, you should be screened for this virus. You are considered at high risk for hepatitis B if: ? You were born in a country where hepatitis B is common. Ask your health care provider which countries are considered high risk. ? Your parents were born in a high-risk country, and you have not been immunized against hepatitis B (hepatitis B vaccine). ? You have HIV or AIDS. ? You use needles to inject street drugs. ? You live with someone who has hepatitis B. ? You have had sex with someone who has hepatitis B. ? You get hemodialysis treatment. ? You take certain medicines for conditions, including cancer, organ transplantation, and autoimmune conditions.  Hepatitis C  Blood testing is recommended for: ? Everyone born from [birth year] through 1963. ? Anyone with known risk factors for hepatitis C.  Sexually transmitted  infections (STIs)  You should be screened for sexually transmitted infections (STIs) including gonorrhea and chlamydia if: ? You are sexually active and are younger than 59 years of age. ? You are older than 59 years of age and your health care provider tells you that you are at risk for this type of infection. ? Your sexual activity has changed since you were last screened and you are at an increased risk for  chlamydia or gonorrhea. Ask your health care provider if you are at risk.  If you do not have HIV, but are at risk, it may be recommended that you take a prescription medicine daily to prevent HIV infection. This is called pre-exposure prophylaxis (PrEP). You are considered at risk if: ? You are sexually active and do not regularly use condoms or know the HIV status of your partner(s). ? You take drugs by injection. ? You are sexually active with a partner who has HIV.  Talk with your health care provider about whether you are at high risk of being infected with HIV. If you choose to begin PrEP, you should first be tested for HIV. You should then be tested every 3 months for as long as you are taking PrEP. Pregnancy  If you are premenopausal and you may become pregnant, ask your health care provider about preconception counseling.  If you may become pregnant, take 400 to 800 micrograms (mcg) of folic acid every day.  If you want to prevent pregnancy, talk to your health care provider about birth control (contraception). Osteoporosis and menopause  Osteoporosis is a disease in which the bones lose minerals and strength with aging. This can result in serious bone fractures. Your risk for osteoporosis can be identified using a bone density scan.  If you are 4 years of age or older, or if you are at risk for osteoporosis and fractures, ask your health care provider if you should be screened.  Ask your health care provider whether you should take a calcium or vitamin D supplement to lower  your risk for osteoporosis.  Menopause may have certain physical symptoms and risks.  Hormone replacement therapy may reduce some of these symptoms and risks. Talk to your health care provider about whether hormone replacement therapy is right for you. Follow these instructions at home:  Schedule regular health, dental, and eye exams.  Stay current with your immunizations.  Do not use any tobacco products including cigarettes, chewing tobacco, or electronic cigarettes.  If you are pregnant, do not drink alcohol.  If you are breastfeeding, limit how much and how often you drink alcohol.  Limit alcohol intake to no more than 1 drink per day for nonpregnant women. One drink equals 12 ounces of beer, 5 ounces of wine, or 1 ounces of hard liquor.  Do not use street drugs.  Do not share needles.  Ask your health care provider for help if you need support or information about quitting drugs.  Tell your health care provider if you often feel depressed.  Tell your health care provider if you have ever been abused or do not feel safe at home. This information is not intended to replace advice given to you by your health care provider. Make sure you discuss any questions you have with your health care provider. Document Released: 08/30/2010 Document Revised: 07/23/2015 Document Reviewed: 11/18/2014 Elsevier Interactive Patient Education  [employer].

## 2016-10-18 NOTE — Assessment & Plan Note (Signed)
Symptoms well controlled. Will try lower dose of Advair to see if similar results. Will follow.

## 2016-10-18 NOTE — Assessment & Plan Note (Signed)
Scheduled 3d for patient which she is aware.

## 2016-10-18 NOTE — Progress Notes (Signed)
Subjective:    Patient ID: Kristen Sandoval, female    DOB: August 20, 1955, 61 y.o.   MRN: [id number]  CC: Kristen Sandoval is a 61 y.o. female who presents today for physical exam.    HPI: Feeling well today  Cough- no cough, wheezing, CP. Using Advair. Symptoms worse in humidity or running cold air.       Colorectal Cancer Screening: UTD per patient; reports Dr Candace Cruise. Told a 10 year repeat, thinks had it after 61 years of age.  Breast Cancer Screening: Mammogram due Cervical Cancer Screening: h/o hysterectomy, no longer has pap smear.  Bone Health screening/DEXA for 65+: No increased fracture risk. Defer screening at this time. Lung Cancer Screening: Doesn't have 30 year pack year history and age > 31 years.       Tetanus - utd        Pneumococcal - complete Hepatitis C screening - Complete HIV Screening- Candidate for, consents Labs: Screening labs today. Exercise: Gets regular exercise.  Alcohol use: moderate use Smoking/tobacco use: Former smoker; quit 15 or more years ago.   Regular dental exams: UTD Wears seat belt: Yes. Skin: no new skin lesions; no h/o skin cancer.   HISTORY:  Past Medical History:  Diagnosis Date  . Allergy   . Anxiety   . Arthritis   . Asthma   . GERD (gastroesophageal reflux disease)     Past Surgical History:  Procedure Laterality Date  . ABDOMINAL HYSTERECTOMY  1990's  . KNEE SURGERY Left 09/2009  . OOPHORECTOMY Bilateral 1990's  . TONSILLECTOMY AND ADENOIDECTOMY     Family History  Problem Relation Age of Onset  . Adopted: Yes  . Lung cancer Mother       ALLERGIES: Clarithromycin; Erythromycin; and Latex  Current Outpatient Prescriptions on File Prior to Visit  Medication Sig Dispense Refill  . albuterol (VENTOLIN HFA) 108 (90 Base) MCG/ACT inhaler Inhale 2 puffs into the lungs every 6 (six) hours as needed for wheezing or shortness of breath. 18 g 2  . fluticasone (FLONASE) 50 MCG/ACT nasal spray     . sertraline (ZOLOFT) 100 MG  tablet Take 1 tablet (100 mg total) by mouth daily. 90 tablet 1   No current facility-administered medications on file prior to visit.     Social History  Substance Use Topics  . Smoking status: Former Smoker    Quit date: 02/27/2006  . Smokeless tobacco: Never Used  . Alcohol use 0.0 oz/week     Comment: Moderate use    Review of Systems  Constitutional: Negative for chills, fever and unexpected weight change.  HENT: Negative for congestion.   Respiratory: Negative for cough, shortness of breath and wheezing.   Cardiovascular: Negative for chest pain, palpitations and leg swelling.  Gastrointestinal: Negative for nausea and vomiting.  Musculoskeletal: Negative for arthralgias and myalgias.  Skin: Negative for rash.  Neurological: Negative for headaches.  Hematological: Negative for adenopathy.  Psychiatric/Behavioral: Negative for confusion.      Objective:    BP 136/72 (BP Location: Left Arm, Patient Position: Sitting, Cuff Size: Normal)   Pulse 78   Temp 98.6 F (37 C) (Oral)   Resp 12   Ht 5\' 1"  (1.549 m)   Wt 119 lb 6.4 oz (54.2 kg)   SpO2 96%   BMI 22.56 kg/m   BP Readings from Last 3 Encounters:  10/18/16 136/72  08/08/16 (!) 142/76  12/22/15 122/64   Wt Readings from Last 3 Encounters:  10/18/16 119 lb  6.4 oz (54.2 kg)  08/08/16 120 lb 9.6 oz (54.7 kg)  12/22/15 118 lb (53.5 kg)    Physical Exam  Constitutional: She appears well-developed and well-nourished.  Eyes: Conjunctivae are normal.  Neck: No thyroid mass and no thyromegaly present.  Cardiovascular: Normal rate, regular rhythm, normal heart sounds and normal pulses.   Pulmonary/Chest: Effort normal and breath sounds normal. She has no wheezes. She has no rhonchi. She has no rales. Right breast exhibits no inverted nipple, no mass, no nipple discharge, no skin change and no tenderness. Left breast exhibits no inverted nipple, no mass, no nipple discharge, no skin change and no tenderness. Breasts  are symmetrical.  No masses or asymmetry appreciated during CBE.  Genitourinary: Right adnexum displays no mass, no tenderness and no fullness. Left adnexum displays no mass, no tenderness and no fullness.  Genitourinary Comments: Pap performed. No cervix visualized. No CMT. Unable to appreciated ovaries.  Lymphadenopathy:       Head (right side): No submental, no submandibular, no tonsillar, no preauricular, no posterior auricular and no occipital adenopathy present.       Head (left side): No submental, no submandibular, no tonsillar, no preauricular, no posterior auricular and no occipital adenopathy present.       Right cervical: No superficial cervical, no deep cervical and no posterior cervical adenopathy present.      Left cervical: No superficial cervical, no deep cervical and no posterior cervical adenopathy present.    She has no axillary adenopathy.       Right axillary: No pectoral and no lateral adenopathy present.       Left axillary: No pectoral and no lateral adenopathy present. Neurological: She is alert.  Skin: Skin is warm and dry.  Psychiatric: She has a normal mood and affect. Her speech is normal and behavior is normal. Thought content normal.  Vitals reviewed.      Assessment & Plan:   Problem List Items Addressed This Visit      Respiratory   Airway hyperreactivity    Symptoms well controlled. Will try lower dose of Advair to see if similar results. Will follow.       Relevant Medications   Fluticasone-Salmeterol (ADVAIR DISKUS) 100-50 MCG/DOSE AEPB     Other   Routine physical examination - Primary    h/o hysterectomy, no cervix seen on exam. Pap of vaginal wall. CBE performed. Screening labs ordered. Will bring records of colonoscopy for records as states UTD.       Relevant Orders   CBC with Differential/Platelet   Comprehensive metabolic panel   Hemoglobin A1c   HIV antibody   Lipid panel   Cytology - PAP   VITAMIN D 25 Hydroxy (Vit-D Deficiency,  Fractures)   TSH   Screening for breast cancer    Scheduled 3d for patient which she is aware.      Relevant Orders   MM SCREENING BREAST TOMO BILATERAL       I have discontinued Ms. Biedermann's PROAIR HFA and ADVAIR DISKUS. I have also changed her Fluticasone-Salmeterol. Additionally, I am having her maintain her fluticasone, sertraline, and albuterol.   Meds ordered this encounter  Medications  . DISCONTD: Fluticasone-Salmeterol (ADVAIR DISKUS) 100-50 MCG/DOSE AEPB    Sig: Inhale 1 puff into the lungs daily.    Dispense:  60 each    Refill:  3    Order Specific Question:   Supervising Provider    Answer:   Deborra Medina L [[id number]]  . Fluticasone-Salmeterol (  ADVAIR DISKUS) 100-50 MCG/DOSE AEPB    Sig: Inhale 1 puff into the lungs 2 (two) times daily.    Dispense:  60 each    Refill:  3    Order Specific Question:   Supervising Provider    Answer:   Crecencio Mc [[id number]]    Return precautions given.   Risks, benefits, and alternatives of the medications and treatment plan prescribed today were discussed, and patient expressed understanding.   Education regarding symptom management and diagnosis given to patient on AVS.   Continue to follow with Burnard Hawthorne, FNP for routine health maintenance.   Bubba Camp and I agreed with plan.   Mable Paris, FNP

## 2016-10-18 NOTE — Assessment & Plan Note (Signed)
h/o hysterectomy, no cervix seen on exam. Pap of vaginal wall. CBE performed. Screening labs ordered. Will bring records of colonoscopy for records as states UTD.

## 2016-10-19 LAB — CYTOLOGY - PAP
Diagnosis: NEGATIVE
HPV: NOT DETECTED

## 2016-10-23 DIAGNOSIS — Z23 Encounter for immunization: Secondary | ICD-10-CM | POA: Diagnosis not present

## 2016-11-18 ENCOUNTER — Ambulatory Visit
Admission: RE | Admit: 2016-11-18 | Discharge: 2016-11-18 | Disposition: A | Discharge status: Home / Self Care | Payer: 59 | Source: Ambulatory Visit | Attending: Family | Admitting: Family

## 2016-11-18 DIAGNOSIS — Z1239 Encounter for other screening for malignant neoplasm of breast: Secondary | ICD-10-CM

## 2016-11-18 DIAGNOSIS — Z1231 Encounter for screening mammogram for malignant neoplasm of breast: Secondary | ICD-10-CM | POA: Diagnosis present

## 2016-11-23 ENCOUNTER — Inpatient Hospital Stay
Admission: RE | Admit: 2016-11-23 | Discharge: 2016-11-23 | Disposition: A | Discharge status: Home / Self Care | Payer: Self-pay | Source: Ambulatory Visit | Attending: *Deleted | Admitting: *Deleted

## 2016-11-23 ENCOUNTER — Other Ambulatory Visit: Payer: Self-pay | Admitting: *Deleted

## 2016-11-23 DIAGNOSIS — Z9289 Personal history of other medical treatment: Secondary | ICD-10-CM

## 2017-02-09 ENCOUNTER — Encounter (INDEPENDENT_AMBULATORY_CARE_PROVIDER_SITE_OTHER): Payer: Self-pay | Admitting: Orthopedic Surgery

## 2017-02-09 ENCOUNTER — Ambulatory Visit (INDEPENDENT_AMBULATORY_CARE_PROVIDER_SITE_OTHER): Payer: 59 | Admitting: Orthopedic Surgery

## 2017-02-09 DIAGNOSIS — M1712 Unilateral primary osteoarthritis, left knee: Secondary | ICD-10-CM

## 2017-02-09 MED ORDER — METHYLPREDNISOLONE ACETATE 40 MG/ML IJ SUSP
40.0000 mg | INTRAMUSCULAR | Status: AC | PRN
  Administered 2017-02-09: 40 mg via INTRA_ARTICULAR

## 2017-02-09 MED ORDER — BUPIVACAINE HCL 0.25 % IJ SOLN
4.0000 mL | INTRAMUSCULAR | Status: AC | PRN
  Administered 2017-02-09: 4 mL via INTRA_ARTICULAR

## 2017-02-09 MED ORDER — LIDOCAINE HCL 1 % IJ SOLN
5.0000 mL | INTRAMUSCULAR | Status: AC | PRN
  Administered 2017-02-09: 5 mL

## 2017-02-09 NOTE — Progress Notes (Signed)
Office Visit Note   Patient: Kristen Sandoval           Date of Birth: [birth year]           MRN: [id number] Visit Date: 02/09/2017 Requested by: Burnard Hawthorne, FNP [id number] [place], [place] [address] PCP: Burnard Hawthorne, FNP  Subjective: Chief Complaint  Patient presents with  . Left Knee - Pain    HPI: Kristen Sandoval is a 61 year old female with left knee pain.  She describes left knee pain and swelling.  She enjoys running she does a lot of fitness activities.  She denies any mechanical symptoms in the left knee.  She is not taking any anti-inflammatory medication and she does not use a brace.              ROS: All systems reviewed are negative as they relate to the chief complaint within the history of present illness.  Patient denies  fevers or chills.   Assessment & Plan: Visit Diagnoses:  1. Unilateral primary osteoarthritis, left knee     Plan: Impression is left knee exacerbation of patellofemoral arthritis.  Plan is aspiration and injection.  She should continue her nonweightbearing quad strengthening exercises and try to run mostly on flat ground.  I think we can manage this reasonably well with episodic injections primarily because her quad strength body mass index are very close to ideal.  Follow-Up Instructions: Return if symptoms worsen or fail to improve.   Orders:  No orders of the defined types were placed in this encounter.  No orders of the defined types were placed in this encounter.     Procedures: Large Joint Inj: L knee on 02/09/2017 7:35 PM Indications: diagnostic evaluation, joint swelling and pain Details: 18 G 1.5 in needle, superolateral approach  Arthrogram: No  Medications: 5 mL lidocaine 1 %; 40 mg methylPREDNISolone acetate 40 MG/ML; 4 mL bupivacaine 0.25 % Aspirate: 40 mL yellow Outcome: tolerated well, no immediate complications Procedure, treatment alternatives, risks and benefits explained, specific risks discussed.  Consent was given by the patient. Immediately prior to procedure a time out was called to verify the correct patient, procedure, equipment, support staff and site/side marked as required. Patient was prepped and draped in the usual sterile fashion.       Clinical Data: No additional findings.  Objective: Vital Signs: There were no vitals taken for this visit.  Physical Exam:   Constitutional: Patient appears well-developed HEENT:  Head: Normocephalic Eyes:EOM are normal Neck: Normal range of motion Cardiovascular: Normal rate Pulmonary/chest: Effort normal Neurologic: Patient is alert Skin: Skin is warm Psychiatric: Patient has normal mood and affect    Ortho Exam: Orthopedic exam demonstrates normal body mass index normal alignment.  Mild effusion present in the left knee.  Range of motion excellent.  Mild patellofemoral crepitus present on the left not on the right.  Extensor mechanism intact.  Patellar mobility normal without apprehension.  Pedal pulses palpable.  Specialty Comments:  No specialty comments available.  Imaging: No results found.   PMFS History: Patient Active Problem List   Diagnosis Date Noted  . Routine physical examination 10/18/2016  . Screening for breast cancer 10/18/2016  . Anxiety 02/10/2015  . Osteopenia 02/10/2015  . 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 Nonspecific abnormal finding in stool contents 02/10/2015  . 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 Avitaminosis D 09/02/2007  . Acid reflux 04/01/2007  . 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 Anxiety and depression  10/20/2006  . 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 Hypercholesterolemia without hypertriglyceridemia 10/20/2006   Past Medical History:  Diagnosis Date  . Allergy   . Anxiety   . Arthritis   . Asthma   . GERD (gastroesophageal reflux disease)     Family History  Adopted: Yes  Problem Relation Age of Onset  . Lung cancer Mother     Past  Surgical History:  Procedure Laterality Date  . ABDOMINAL HYSTERECTOMY  1990's  . KNEE SURGERY Left 09/2009  . OOPHORECTOMY Bilateral 1990's  . TONSILLECTOMY AND ADENOIDECTOMY     Social History   Occupational History  . Not on file  Tobacco Use  . Smoking status: Former Smoker    Last attempt to quit: 02/27/2006    Years since quitting: 10.9  . Smokeless tobacco: Never Used  Substance and Sexual Activity  . Alcohol use: Yes    Alcohol/week: 0.0 oz    Comment: Moderate use  . Drug use: No  . Sexual activity: Not on file

## 2017-03-30 ENCOUNTER — Other Ambulatory Visit: Payer: Self-pay | Admitting: Family Medicine

## 2017-04-10 ENCOUNTER — Ambulatory Visit: Payer: 59 | Admitting: Family

## 2017-04-21 ENCOUNTER — Ambulatory Visit: Payer: 59 | Admitting: Family

## 2017-04-21 ENCOUNTER — Encounter: Payer: Self-pay | Admitting: Family

## 2017-04-21 VITALS — BP 138/68 | HR 74 | Temp 98.1°F | Resp 12 | Wt 124.4 lb

## 2017-04-21 DIAGNOSIS — F329 Major depressive disorder, single episode, unspecified: Secondary | ICD-10-CM | POA: Diagnosis not present

## 2017-04-21 DIAGNOSIS — F419 Anxiety disorder, unspecified: Secondary | ICD-10-CM | POA: Diagnosis not present

## 2017-04-21 DIAGNOSIS — F32A Depression, unspecified: Secondary | ICD-10-CM

## 2017-04-21 MED ORDER — BUSPIRONE HCL 7.5 MG PO TABS: 7.5000 mg | ORAL_TABLET | Freq: Two times a day (BID) | ORAL | 2 refills | Status: AC

## 2017-04-21 NOTE — Progress Notes (Signed)
Subjective:    Patient ID: Kristen Sandoval, female    DOB: 04-25-1955, 62 y.o.   MRN: [id number]  CC: Kristen Sandoval is a 62 y.o. female who presents today for follow up.   HPI: Depression and anxiety- didn't like the 100mg  zoloft dose, back to 50mg . 'feels more anxious of late.' No depression. Would like to stay in zoloft 50mg . Sleeping well. Excercises 3-4 days per week. No thoughts of hurting herself or anyone else.  Excited about first grandchild coming in august 2019.      HISTORY:  Past Medical History:  Diagnosis Date  . Allergy   . Anxiety   . Arthritis   . Asthma   . GERD (gastroesophageal reflux disease)    Past Surgical History:  Procedure Laterality Date  . ABDOMINAL HYSTERECTOMY  1990's  . KNEE SURGERY Left 09/2009  . OOPHORECTOMY Bilateral 1990's  . TONSILLECTOMY AND ADENOIDECTOMY     Family History  Adopted: Yes  Problem Relation Age of Onset  . Lung cancer Mother     Allergies: Clarithromycin; Erythromycin; and Latex Current Outpatient Medications on File Prior to Visit  Medication Sig Dispense Refill  . albuterol (VENTOLIN HFA) 108 (90 Base) MCG/ACT inhaler Inhale 2 puffs into the lungs every 6 (six) hours as needed for wheezing or shortness of breath. 18 g 2  . fluticasone (FLONASE) 50 MCG/ACT nasal spray     . Fluticasone-Salmeterol (ADVAIR DISKUS) 100-50 MCG/DOSE AEPB Inhale 1 puff into the lungs 2 (two) times daily. 60 each 3  . sertraline (ZOLOFT) 100 MG tablet Take 1 tablet (100 mg total) by mouth daily. 90 tablet 1   No current facility-administered medications on file prior to visit.     Social History   Tobacco Use  . Smoking status: Former Smoker    Last attempt to quit: 02/27/2006    Years since quitting: 11.1  . Smokeless tobacco: Never Used  Substance Use Topics  . Alcohol use: Yes    Alcohol/week: 0.0 oz    Comment: Moderate use  . Drug use: No    Review of Systems  Constitutional: Negative for chills and fever.  Respiratory:  Negative for cough.   Cardiovascular: Negative for chest pain and palpitations.  Gastrointestinal: Negative for nausea and vomiting.  Psychiatric/Behavioral: Negative for sleep disturbance and suicidal ideas.      Objective:    BP 138/68 (BP Location: Left Arm, Patient Position: Standing, Cuff Size: Normal)   Pulse 74   Temp 98.1 F (36.7 C) (Oral)   Resp 12   Wt 124 lb 6 oz (56.4 kg)   SpO2 97%   BMI 23.50 kg/m  BP Readings from Last 3 Encounters:  04/21/17 138/68  10/18/16 136/72  08/08/16 (!) 142/76   Wt Readings from Last 3 Encounters:  04/21/17 124 lb 6 oz (56.4 kg)  10/18/16 119 lb 6.4 oz (54.2 kg)  08/08/16 120 lb 9.6 oz (54.7 kg)    Physical Exam  Constitutional: She appears well-developed and well-nourished.  Eyes: Conjunctivae are normal.  Cardiovascular: Normal rate, regular rhythm, normal heart sounds and normal pulses.  Pulmonary/Chest: Effort normal and breath sounds normal. She has no wheezes. She has no rhonchi. She has no rales.  Neurological: She is alert.  Skin: Skin is warm and dry.  Psychiatric: She has a normal mood and affect. Her speech is normal and behavior is normal. Thought content normal.  Vitals reviewed.      Assessment & Plan:   Problem List  Items Addressed This Visit      Other   Anxiety and depression - Primary    Will maintain on zoloft. Trial of buspar for anxiety. She will let me know how she is doing      Relevant Medications   busPIRone (BUSPAR) 7.5 MG tablet       I am having Anysha B. Loma start on busPIRone. I am also having her maintain her fluticasone, sertraline, albuterol, and Fluticasone-Salmeterol.   Meds ordered this encounter  Medications  . busPIRone (BUSPAR) 7.5 MG tablet    Sig: Take 1 tablet (7.5 mg total) by mouth 2 (two) times daily.    Dispense:  60 tablet    Refill:  2    Order Specific Question:   Supervising Provider    Answer:   Crecencio Mc [[id number]]    Return precautions given.    Risks, benefits, and alternatives of the medications and treatment plan prescribed today were discussed, and patient expressed understanding.   Education regarding symptom management and diagnosis given to patient on AVS.  Continue to follow with Burnard Hawthorne, FNP for routine health maintenance.   Bubba Camp and I agreed with plan.   Mable Paris, FNP

## 2017-04-21 NOTE — Assessment & Plan Note (Signed)
Will maintain on zoloft. Trial of buspar for anxiety. She will let me know how she is doing

## 2017-04-21 NOTE — Patient Instructions (Addendum)
Trial of buspar.   Stay on zoloft  Let me know how you are doing.

## 2017-05-15 ENCOUNTER — Other Ambulatory Visit: Payer: Self-pay

## 2017-05-15 ENCOUNTER — Ambulatory Visit: Payer: 59 | Admitting: Family Medicine

## 2017-05-15 VITALS — BP 138/80 | HR 80 | Temp 98.5°F | Wt 123.0 lb

## 2017-05-15 DIAGNOSIS — J40 Bronchitis, not specified as acute or chronic: Secondary | ICD-10-CM | POA: Diagnosis not present

## 2017-05-15 DIAGNOSIS — J029 Acute pharyngitis, unspecified: Secondary | ICD-10-CM

## 2017-05-15 LAB — POCT RAPID STREP A (OFFICE): RAPID STREP A SCREEN: NEGATIVE

## 2017-05-15 MED ORDER — BENZONATATE 200 MG PO CAPS: 200.0000 mg | ORAL_CAPSULE | Freq: Two times a day (BID) | ORAL | 0 refills | Status: DC | PRN

## 2017-05-15 MED ORDER — DOXYCYCLINE HYCLATE 100 MG PO TABS: 100.0000 mg | ORAL_TABLET | Freq: Two times a day (BID) | ORAL | 0 refills | Status: DC

## 2017-05-15 NOTE — Progress Notes (Signed)
  Tommi Rumps, MD Phone: [phone]  Kristen Sandoval is a 62 y.o. female who presents today for f/u.  Patient presents with symptoms starting 9 days ago.  Started with sore throat and postnasal drip with some upper respiratory congestion.  Very quickly turned into chest congestion with cough productive of green mucus.  Some congestion in her sinuses.  Some sore throat from the postnasal drip.  T-max of 99.2 F.  No shortness of breath.  Does not feel as though she is improving.  Has been using Robitussin-DM with little benefit.  She continues on her Advair and albuterol.  Notes the albuterol is not helping significantly.    Social History   Tobacco Use  Smoking Status Former Smoker  . Last attempt to quit: 02/27/2006  . Years since quitting: 11.2  Smokeless Tobacco Never Used     ROS see history of present illness  Objective  Physical Exam Vitals:   05/15/17 1118  BP: 138/80  Pulse: 80  Temp: 98.5 F (36.9 C)  SpO2: 96%    BP Readings from Last 3 Encounters:  05/15/17 138/80  04/21/17 138/68  10/18/16 136/72   Wt Readings from Last 3 Encounters:  05/15/17 123 lb (55.8 kg)  04/21/17 124 lb 6 oz (56.4 kg)  10/18/16 119 lb 6.4 oz (54.2 kg)    Physical Exam  Constitutional: No distress.  HENT:  Head: Normocephalic and atraumatic.  Mouth/Throat: No oropharyngeal exudate.  Minimal posterior oropharyngeal erythema  Eyes: Conjunctivae are normal. Pupils are equal, round, and reactive to light.  Neck: Neck supple.  Cardiovascular: Normal rate, regular rhythm and normal heart sounds.  Pulmonary/Chest: Effort normal. No respiratory distress. She has no wheezes.  Coarse breath sounds throughout  Musculoskeletal: She exhibits no edema.  Lymphadenopathy:    She has no cervical adenopathy.  Neurological: She is alert. Gait normal.  Skin: Skin is warm and dry. She is not diaphoretic.     Assessment/Plan: Please see individual problem list.  Bronchitis Symptoms most  consistent with bronchitis.  Could have some measure of a sinus infection as well.  Vital signs stable.  No acute distress.  Will treat with doxycycline to provide coverage for both.  Tessalon for cough.  She will continue her inhalers.  Given return precautions.   Orders Placed This Encounter  Procedures  . POCT rapid strep A    Meds ordered this encounter  Medications  . doxycycline (VIBRA-TABS) 100 MG tablet    Sig: Take 1 tablet (100 mg total) by mouth 2 (two) times daily.    Dispense:  14 tablet    Refill:  0  . benzonatate (TESSALON) 200 MG capsule    Sig: Take 1 capsule (200 mg total) by mouth 2 (two) times daily as needed for cough.    Dispense:  20 capsule    Refill:  0     Tommi Rumps, MD [place]

## 2017-05-15 NOTE — Assessment & Plan Note (Signed)
Symptoms most consistent with bronchitis.  Could have some measure of a sinus infection as well.  Vital signs stable.  No acute distress.  Will treat with doxycycline to provide coverage for both.  Tessalon for cough.  She will continue her inhalers.  Given return precautions.

## 2017-05-15 NOTE — Patient Instructions (Signed)
Nice to see you. Please take the antibiotic.  You can take the [facility] for your cough. Please continue to use your inhalers. If you develop shortness of breath, cough productive of blood, persistent fevers, or any new or changing symptoms please seek medical attention immediately.

## 2017-09-02 ENCOUNTER — Other Ambulatory Visit: Payer: Self-pay | Admitting: Family

## 2017-10-02 DIAGNOSIS — Z23 Encounter for immunization: Secondary | ICD-10-CM | POA: Diagnosis not present

## 2017-10-19 ENCOUNTER — Encounter: Payer: 59 | Admitting: Family

## 2017-10-20 ENCOUNTER — Encounter: Payer: 59 | Admitting: Family

## 2017-10-25 DIAGNOSIS — Z23 Encounter for immunization: Secondary | ICD-10-CM | POA: Diagnosis not present

## 2017-11-08 ENCOUNTER — Encounter: Payer: 59 | Admitting: Family

## 2017-11-15 ENCOUNTER — Ambulatory Visit (INDEPENDENT_AMBULATORY_CARE_PROVIDER_SITE_OTHER): Payer: 59 | Admitting: Orthopedic Surgery

## 2017-11-15 ENCOUNTER — Encounter (INDEPENDENT_AMBULATORY_CARE_PROVIDER_SITE_OTHER): Payer: Self-pay | Admitting: Orthopedic Surgery

## 2017-11-15 ENCOUNTER — Ambulatory Visit (INDEPENDENT_AMBULATORY_CARE_PROVIDER_SITE_OTHER): Payer: Self-pay

## 2017-11-15 DIAGNOSIS — M79641 Pain in right hand: Secondary | ICD-10-CM

## 2017-11-16 ENCOUNTER — Telehealth (INDEPENDENT_AMBULATORY_CARE_PROVIDER_SITE_OTHER): Payer: Self-pay | Admitting: Radiology

## 2017-11-16 ENCOUNTER — Encounter (INDEPENDENT_AMBULATORY_CARE_PROVIDER_SITE_OTHER): Payer: Self-pay | Admitting: Orthopedic Surgery

## 2017-11-16 NOTE — Progress Notes (Signed)
Office Visit Note   Patient: Kristen Sandoval           Date of Birth: 12/28/1955           MRN: [id number] Visit Date: 11/15/2017 Requested by: Burnard Hawthorne, FNP [address] [place], [place] [address] PCP: Burnard Hawthorne, FNP  Subjective: Chief Complaint  Patient presents with  . Right Hand - Pain  . Right Wrist - Pain    HPI: Kristen Sandoval is a patient with right wrist and right thumb and hand pain.  She is had 3 to 4 weeks of symptoms.  No history of injury.  She is right-hand dominant.  Reports primarily radial sided pain.  She is losing dexterity in that hand.  Hurts her to squeeze things.  She does like to do weights.  Denies any prior injury to the thumb.  She does report some numbness and tingling in the 3 radial fingers.              ROS: All systems reviewed are negative as they relate to the chief complaint within the history of present illness.  Patient denies  fevers or chills.   Assessment & Plan: Visit Diagnoses:  1. Pain in right hand     Plan: Impression is right hand and thumb pain.  She does have a component of what sounds like possible nerve compression and carpal tunnel syndrome.  She also has on examination and radiographs findings consistent with CMC arthritis as well as some deformity both active and static at the MCP joint of the thumb.  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  Plan MRI right thumb to evaluate the [facility] joint as well as for chronic attritional ulnar collateral ligament injury as well as nerve conduction study on that hand to evaluate for carpal tunnel.  I will see her back after those studies  Follow-Up Instructions: Return for after MRI.   Orders:  Orders Placed This Encounter  Procedures  . XR Hand Complete Right  . MR FINGERS RICHT WO CONTRAST  . Ambulatory referral to Physical Medicine Rehab   No orders of the defined types were placed in this  encounter.     Procedures: No procedures performed   Clinical Data: No additional findings.  Objective: Vital Signs: There were no vitals taken for this visit.  Physical Exam:   Constitutional: Patient appears well-developed HEENT:  Head: Normocephalic Eyes:EOM are normal Neck: Normal range of motion Cardiovascular: Normal rate Pulmonary/chest: Effort normal Neurologic: Patient is alert Skin: Skin is warm Psychiatric: Patient has normal mood and affect    Ortho Exam: Ortho exam demonstrates laxity at the MCP joint to radial stress.  EPL FPL intact.  Hyperextension deformity is present along the long axis of the thumb.  CMC grind test positive on that side.  Patient has good EPL FPL interosseous function.  Radial pulses intact.  No other masses lymph adenopathy or skin changes noted in that right thumb region.  Negative Tinel's at the elbow.  I think this has the potential to be a complex reconstruction of that right thumb region  Specialty Comments:  No specialty comments available.  Imaging: Xr Hand Complete Right  Result Date: 11/15/2017 AP lateral oblique right hand reviewed.  Radiocarpal alignment normal.  Moderate significant arthritis at the [facility] joint.  There is some mild spurring in the thumb MCP joint.  There is some deformity of the mechanical axis  of that right thumb.    PMFS History: Patient Active Problem List   Diagnosis Date Noted  . Bronchitis 05/15/2017  . Routine physical examination 10/18/2016  . Screening for breast cancer 10/18/2016  . Anxiety 02/10/2015  . Osteopenia 02/10/2015  . 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 Nonspecific abnormal finding in stool contents 02/10/2015  . 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 Avitaminosis D 09/02/2007  . Acid reflux 04/01/2007  . 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 Anxiety and depression 10/20/2006  . 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 Hypercholesterolemia without  hypertriglyceridemia 10/20/2006   Past Medical History:  Diagnosis Date  . Allergy   . Anxiety   . Arthritis   . Asthma   . GERD (gastroesophageal reflux disease)     Family History  Adopted: Yes  Problem Relation Age of Onset  . Lung cancer Mother     Past Surgical History:  Procedure Laterality Date  . ABDOMINAL HYSTERECTOMY  1990's  . KNEE SURGERY Left 09/2009  . OOPHORECTOMY Bilateral 1990's  . TONSILLECTOMY AND ADENOIDECTOMY     Social History   Occupational History  . Not on file  Tobacco Use  . Smoking status: Former Smoker    Last attempt to quit: 02/27/2006    Years since quitting: 11.7  . Smokeless tobacco: Never Used  Substance and Sexual Activity  . Alcohol use: Yes    Alcohol/week: 0.0 standard drinks    Comment: Moderate use  . Drug use: No  . Sexual activity: Not on file

## 2017-11-16 NOTE — Telephone Encounter (Signed)
Patient left voicemail stating returning call to make appointment with Dr. Ernestina Patches. Referral in Epic by Dr. Marlou Sa for EMG/NCV eval for RUE CTS.  Call back # work phone: [phone]

## 2017-11-20 NOTE — Telephone Encounter (Signed)
Pt is scheduled for 12/05/17 for NCS

## 2017-11-23 ENCOUNTER — Other Ambulatory Visit: Payer: Self-pay | Admitting: Family

## 2017-11-23 DIAGNOSIS — J4520 Mild intermittent asthma, uncomplicated: Secondary | ICD-10-CM

## 2017-11-24 ENCOUNTER — Encounter: Payer: 59 | Admitting: Family

## 2017-11-30 ENCOUNTER — Ambulatory Visit
Admission: RE | Admit: 2017-11-30 | Discharge: 2017-11-30 | Disposition: A | Discharge status: Home / Self Care | Payer: Commercial Managed Care - HMO | Source: Ambulatory Visit | Attending: Orthopedic Surgery | Admitting: Orthopedic Surgery

## 2017-11-30 DIAGNOSIS — M948X4 Other specified disorders of cartilage, hand: Secondary | ICD-10-CM | POA: Diagnosis not present

## 2017-11-30 DIAGNOSIS — X58XXXA Exposure to other specified factors, initial encounter: Secondary | ICD-10-CM | POA: Insufficient documentation

## 2017-11-30 DIAGNOSIS — M19041 Primary osteoarthritis, right hand: Secondary | ICD-10-CM | POA: Diagnosis not present

## 2017-11-30 DIAGNOSIS — M25741 Osteophyte, right hand: Secondary | ICD-10-CM | POA: Diagnosis not present

## 2017-11-30 DIAGNOSIS — M25441 Effusion, right hand: Secondary | ICD-10-CM | POA: Insufficient documentation

## 2017-11-30 DIAGNOSIS — S63071A Subluxation of distal end of right ulna, initial encounter: Secondary | ICD-10-CM | POA: Insufficient documentation

## 2017-11-30 DIAGNOSIS — M24141 Other articular cartilage disorders, right hand: Secondary | ICD-10-CM | POA: Diagnosis not present

## 2017-11-30 DIAGNOSIS — M79641 Pain in right hand: Secondary | ICD-10-CM | POA: Diagnosis not present

## 2017-12-06 ENCOUNTER — Ambulatory Visit (INDEPENDENT_AMBULATORY_CARE_PROVIDER_SITE_OTHER): Payer: 59 | Admitting: Physical Medicine and Rehabilitation

## 2017-12-06 ENCOUNTER — Encounter (INDEPENDENT_AMBULATORY_CARE_PROVIDER_SITE_OTHER): Payer: Self-pay | Admitting: Physical Medicine and Rehabilitation

## 2017-12-06 DIAGNOSIS — R202 Paresthesia of skin: Secondary | ICD-10-CM

## 2017-12-06 NOTE — Progress Notes (Signed)
 .  Numeric Pain Rating Scale and Functional Assessment Average Pain 8   In the last MONTH (on 0-10 scale) has pain interfered with the following?  1. General activity like being  able to carry out your everyday physical activities such as walking, climbing stairs, carrying groceries, or moving a chair?  Rating(6)     

## 2017-12-11 ENCOUNTER — Encounter: Payer: Self-pay | Admitting: Family

## 2017-12-11 ENCOUNTER — Ambulatory Visit (INDEPENDENT_AMBULATORY_CARE_PROVIDER_SITE_OTHER): Payer: 59 | Admitting: Family

## 2017-12-11 VITALS — BP 132/80 | HR 80 | Temp 98.0°F | Resp 14 | Ht 61.0 in | Wt 126.0 lb

## 2017-12-11 DIAGNOSIS — Z122 Encounter for screening for malignant neoplasm of respiratory organs: Secondary | ICD-10-CM | POA: Diagnosis not present

## 2017-12-11 DIAGNOSIS — Z0000 Encounter for general adult medical examination without abnormal findings: Secondary | ICD-10-CM | POA: Diagnosis not present

## 2017-12-11 DIAGNOSIS — F419 Anxiety disorder, unspecified: Secondary | ICD-10-CM

## 2017-12-11 DIAGNOSIS — F32A Depression, unspecified: Secondary | ICD-10-CM

## 2017-12-11 DIAGNOSIS — F329 Major depressive disorder, single episode, unspecified: Secondary | ICD-10-CM | POA: Diagnosis not present

## 2017-12-11 MED ORDER — FLUTICASONE PROPIONATE 50 MCG/ACT NA SUSP: 1.0000 | Freq: Every day | NASAL | 5 refills | Status: DC

## 2017-12-11 NOTE — Progress Notes (Signed)
Subjective:    Patient ID: Kristen Sandoval, female    DOB: November 01, 1955, 62 y.o.   MRN: [id number]  CC: Kristen Sandoval is a 62 y.o. female who presents today for physical exam.    HPI: Depression- on zoloft 50mg . Had used buspar prn, which works well for anxiety. No si/hi. Enjoying grandson who is 10 weeks.  Has seasonal allergies. Would like refill of flonase. No fever.      Colorectal Cancer Screening: UTD , 10 year repeat; done with Dr Candace Cruise 2012.  Breast Cancer Screening: Mammogram due Cervical Cancer Screening: UTD; h/o hysterectomy without cervix. No h/o gyn cancer. No vaginal bleeding Bone Health screening/DEXA for 65+: No increased fracture risk. Defer screening at this time. Lung Cancer Screening: quit 12 years ago        Tetanus - utd        Labs: Screening labs today. Exercise: Gets regular exercise. Weight lifts, does boot camp. No CP.  Alcohol use: moderate Smoking/tobacco use: former smoker.  Regular dental exams: UTD Wears seat belt: Yes.  HISTORY:  Past Medical History:  Diagnosis Date  . Allergy   . Anxiety   . Arthritis   . Asthma   . GERD (gastroesophageal reflux disease)     Past Surgical History:  Procedure Laterality Date  . ABDOMINAL HYSTERECTOMY  1990's   NO cervix on exam  . KNEE SURGERY Left 09/2009  . OOPHORECTOMY Bilateral 1990's  . TONSILLECTOMY AND ADENOIDECTOMY     Family History  Adopted: Yes  Problem Relation Age of Onset  . Lung cancer Mother       ALLERGIES: Clarithromycin; Erythromycin; and Latex  Current Outpatient Medications on File Prior to Visit  Medication Sig Dispense Refill  . ADVAIR DISKUS 100-50 MCG/DOSE AEPB INHALE 1 PUFF TWICE A DAY AS DIRECTED **RINSE MOUTH AFTER USE** 60 each 3  . sertraline (ZOLOFT) 50 MG tablet TAKE TWO TABLETS BY MOUTH EVERY DAY (Patient taking differently: 50 mg. ) 180 tablet 1  . VENTOLIN HFA 108 (90 Base) MCG/ACT inhaler INHALE 2 PUFFS INTO LUNGS EVERY 6 HOURS AS NEEDED FOR WHEEZING OR  SHORTNESS OF BREATH 18 g 3   No current facility-administered medications on file prior to visit.     Social History   Tobacco Use  . Smoking status: Former Smoker    Last attempt to quit: 02/27/2006    Years since quitting: 11.7  . Smokeless tobacco: Never Used  Substance Use Topics  . Alcohol use: Yes    Alcohol/week: 0.0 standard drinks    Comment: Moderate use  . Drug use: No    Review of Systems  Constitutional: Negative for chills, fever and unexpected weight change.  HENT: Negative for congestion.   Respiratory: Negative for cough.   Cardiovascular: Negative for chest pain, palpitations and leg swelling.  Gastrointestinal: Negative for nausea and vomiting.  Genitourinary: Negative for vaginal bleeding.  Musculoskeletal: Negative for arthralgias and myalgias.  Skin: Negative for rash.  Neurological: Negative for headaches.  Hematological: Negative for adenopathy.  Psychiatric/Behavioral: Negative for confusion.      Objective:    BP 132/80 (BP Location: Left Arm, Patient Position: Sitting, Cuff Size: Normal)   Pulse 80   Temp 98 F (36.7 C) (Oral)   Resp 14   Ht 5\' 1"  (1.549 m)   Wt 126 lb (57.2 kg)   SpO2 99%   BMI 23.81 kg/m   BP Readings from Last 3 Encounters:  12/11/17 132/80  05/15/17 138/80  04/21/17 138/68   Wt Readings from Last 3 Encounters:  12/11/17 126 lb (57.2 kg)  05/15/17 123 lb (55.8 kg)  04/21/17 124 lb 6 oz (56.4 kg)    Physical Exam  Constitutional: She appears well-developed and well-nourished.  Eyes: Conjunctivae are normal.  Neck: No thyroid mass and no thyromegaly present.  Cardiovascular: Normal rate, regular rhythm, normal heart sounds and normal pulses.  Pulmonary/Chest: Effort normal and breath sounds normal. She has no wheezes. She has no rhonchi. She has no rales. Right breast exhibits no inverted nipple, no mass, no nipple discharge, no skin change and no tenderness. Left breast exhibits no inverted nipple, no mass, no  nipple discharge, no skin change and no tenderness. Breasts are symmetrical.  CBE performed.   Lymphadenopathy:       Head (right side): No submental, no submandibular, no tonsillar, no preauricular, no posterior auricular and no occipital adenopathy present.       Head (left side): No submental, no submandibular, no tonsillar, no preauricular, no posterior auricular and no occipital adenopathy present.    She has no cervical adenopathy.       Right cervical: No superficial cervical, no deep cervical and no posterior cervical adenopathy present.      Left cervical: No superficial cervical, no deep cervical and no posterior cervical adenopathy present.    She has no axillary adenopathy.  Neurological: She is alert.  Skin: Skin is warm and dry.  Psychiatric: She has a normal mood and affect. Her speech is normal and behavior is normal. Thought content normal.  Vitals reviewed.      Assessment & Plan:   Problem List Items Addressed This Visit      Other   Anxiety and depression    Controlled. Continue current regimen.       Routine physical examination - Primary    cbe performed. Declined pelvic exam in the absence of complaints, and pap is UTD. Ct chest lung cancer screening.       Relevant Medications   fluticasone (FLONASE) 50 MCG/ACT nasal spray   Other Relevant Orders   MM 3D SCREEN BREAST BILATERAL   CT CHEST LUNG CANCER SCREENING LOW DOSE WO CONTRAST   CBC with Differential/Platelet   Comprehensive metabolic panel   Hemoglobin A1c   Lipid panel   TSH   VITAMIN D 25 Hydroxy (Vit-D Deficiency, Fractures)    Other Visit Diagnoses    Encounter for screening for malignant neoplasm of respiratory organs       Relevant Orders   CT CHEST LUNG CANCER SCREENING LOW DOSE WO CONTRAST       I have discontinued Kristen Sandoval's doxycycline and benzonatate. I have also changed her fluticasone. Additionally, I am having her maintain her VENTOLIN HFA, ADVAIR DISKUS, and  sertraline.   Meds ordered this encounter  Medications  . fluticasone (FLONASE) 50 MCG/ACT nasal spray    Sig: Place 1 spray into both nostrils daily.    Dispense:  16 g    Refill:  5    Order Specific Question:   Supervising Provider    Answer:   Crecencio Mc [[id number]]    Return precautions given.   Risks, benefits, and alternatives of the medications and treatment plan prescribed today were discussed, and patient expressed understanding.   Education regarding symptom management and diagnosis given to patient on AVS.   Continue to follow with Burnard Hawthorne, FNP for routine health maintenance.   Bubba Camp and I  agreed with plan.   Mable Paris, FNP

## 2017-12-11 NOTE — Assessment & Plan Note (Addendum)
cbe performed. Declined pelvic exam in the absence of complaints, and pap is UTD. Ct chest lung cancer screening.

## 2017-12-11 NOTE — Assessment & Plan Note (Signed)
Controlled. Continue current regimen. 

## 2017-12-11 NOTE — Patient Instructions (Addendum)
Fasting labs when you can  Today we discussed referrals, orders. CT chest lung cancer screen   I have placed these orders in the system for you.  Please be sure to give [place] a call if you have not heard from our office regarding this. We should hear from [place] within ONE week with information regarding your appointment. If not, please let me know immediately.    We placed a referral for mammogram this year. I asked that you call one the below locations and schedule this when it is convenient for you.   As discussed, I would like you to ask for 3D mammogram over the traditional 2D mammogram as new evidence suggest 3D is superior.   Please note that NOT all insurance companies cover 3D and you may have to pay a higher copay. You may call your insurance company to further clarify your benefits.   Options for [place]  [place], [place]  * [facility] [place], [place] * Note if you ask for 3D mammogram at this location, you must request [place], [place] location*    Health Maintenance for Postmenopausal Women Menopause is a normal process in which your reproductive ability comes to an end. This process happens gradually over a span of months to years, usually between the ages of 2 and 48. Menopause is complete when you have missed 12 consecutive menstrual periods. It is important to talk with your health care provider about some of the most common conditions that affect postmenopausal women, such as heart disease, cancer, and bone loss (osteoporosis). Adopting a healthy lifestyle and getting preventive care can help to promote your health and wellness. Those actions can also lower your chances of developing some of these common conditions. What should I know about menopause? During menopause, you may experience a number of symptoms, such  as:  Moderate-to-severe hot flashes.  Night sweats.  Decrease in sex drive.  Mood swings.  Headaches.  Tiredness.  Irritability.  Memory problems.  Insomnia.  Choosing to treat or not to treat menopausal changes is an individual decision that you make with your health care provider. What should I know about hormone replacement therapy and supplements? Hormone therapy products are effective for treating symptoms that are associated with menopause, such as hot flashes and night sweats. Hormone replacement carries certain risks, especially as you become older. If you are thinking about using estrogen or estrogen with progestin treatments, discuss the benefits and risks with your health care provider. What should I know about heart disease and stroke? Heart disease, heart attack, and stroke become more likely as you age. This may be due, in part, to the hormonal changes that your body experiences during menopause. These can affect how your body processes dietary fats, triglycerides, and cholesterol. Heart attack and stroke are both medical emergencies. There are many things that you can do to help prevent heart disease and stroke:  Have your blood pressure checked at least every 1-2 years. High blood pressure causes heart disease and increases the risk of stroke.  If you are 9-31 years old, ask your health care provider if you should take aspirin to prevent a heart attack or a stroke.  Do not use any tobacco products, including cigarettes, chewing tobacco, or electronic cigarettes. If you need help quitting, ask your health care provider.  It is important to  eat a healthy diet and maintain a healthy weight. ? Be sure to include plenty of vegetables, fruits, low-fat dairy products, and lean protein. ? Avoid eating foods that are high in solid fats, added sugars, or salt (sodium).  Get regular exercise. This is one of the most important things that you can do for your health. ? Try  to exercise for at least 150 minutes each week. The type of exercise that you do should increase your heart rate and make you sweat. This is known as moderate-intensity exercise. ? Try to do strengthening exercises at least twice each week. Do these in addition to the moderate-intensity exercise.  Know your numbers.Ask your health care provider to check your cholesterol and your blood glucose. Continue to have your blood tested as directed by your health care provider.  What should I know about cancer screening? There are several types of cancer. Take the following steps to reduce your risk and to catch any cancer development as early as possible. Breast Cancer  Practice breast self-awareness. ? This means understanding how your breasts normally appear and feel. ? It also means doing regular breast self-exams. Let your health care provider know about any changes, no matter how small.  If you are 72 or older, have a clinician do a breast exam (clinical breast exam or CBE) every year. Depending on your age, family history, and medical history, it may be recommended that you also have a yearly breast X-ray (mammogram).  If you have a family history of breast cancer, talk with your health care provider about genetic screening.  If you are at high risk for breast cancer, talk with your health care provider about having an MRI and a mammogram every year.  Breast cancer (BRCA) gene test is recommended for women who have family members with BRCA-related cancers. Results of the assessment will determine the need for genetic counseling and BRCA1 and for BRCA2 testing. BRCA-related cancers include these types: ? Breast. This occurs in males or females. ? Ovarian. ? Tubal. This may also be called fallopian tube cancer. ? Cancer of the abdominal or pelvic lining (peritoneal cancer). ? Prostate. ? Pancreatic.  Cervical, Uterine, and Ovarian Cancer Your health care provider may recommend that you be  screened regularly for cancer of the pelvic organs. These include your ovaries, uterus, and vagina. This screening involves a pelvic exam, which includes checking for microscopic changes to the surface of your cervix (Pap test).  For women ages 21-65, health care providers may recommend a pelvic exam and a Pap test every three years. For women ages 35-65, they may recommend the Pap test and pelvic exam, combined with testing for human papilloma virus (HPV), every five years. Some types of HPV increase your risk of cervical cancer. Testing for HPV may also be done on women of any age who have unclear Pap test results.  Other health care providers may not recommend any screening for nonpregnant women who are considered low risk for pelvic cancer and have no symptoms. Ask your health care provider if a screening pelvic exam is right for you.  If you have had past treatment for cervical cancer or a condition that could lead to cancer, you need Pap tests and screening for cancer for at least 20 years after your treatment. If Pap tests have been discontinued for you, your risk factors (such as having a new sexual partner) need to be reassessed to determine if you should start having screenings again. Some women have  medical problems that increase the chance of getting cervical cancer. In these cases, your health care provider may recommend that you have screening and Pap tests more often.  If you have a family history of uterine cancer or ovarian cancer, talk with your health care provider about genetic screening.  If you have vaginal bleeding after reaching menopause, tell your health care provider.  There are currently no reliable tests available to screen for ovarian cancer.  Lung Cancer Lung cancer screening is recommended for adults 27-24 years old who are at high risk for lung cancer because of a history of smoking. A yearly low-dose CT scan of the lungs is recommended if you:  Currently  smoke.  Have a history of at least 30 pack-years of smoking and you currently smoke or have quit within the past 15 years. A pack-year is smoking an average of one pack of cigarettes per day for one year.  Yearly screening should:  Continue until it has been 15 years since you quit.  Stop if you develop a health problem that would prevent you from having lung cancer treatment.  Colorectal Cancer  This type of cancer can be detected and can often be prevented.  Routine colorectal cancer screening usually begins at age 105 and continues through age 110.  If you have risk factors for colon cancer, your health care provider may recommend that you be screened at an earlier age.  If you have a family history of colorectal cancer, talk with your health care provider about genetic screening.  Your health care provider may also recommend using home test kits to check for hidden blood in your stool.  A small camera at the end of a tube can be used to examine your colon directly (sigmoidoscopy or colonoscopy). This is done to check for the earliest forms of colorectal cancer.  Direct examination of the colon should be repeated every 5-10 years until age 96. However, if early forms of precancerous polyps or small growths are found or if you have a family history or genetic risk for colorectal cancer, you may need to be screened more often.  Skin Cancer  Check your skin from head to toe regularly.  Monitor any moles. Be sure to tell your health care provider: ? About any new moles or changes in moles, especially if there is a change in a mole's shape or color. ? If you have a mole that is larger than the size of a pencil eraser.  If any of your family members has a history of skin cancer, especially at a young age, talk with your health care provider about genetic screening.  Always use sunscreen. Apply sunscreen liberally and repeatedly throughout the day.  Whenever you are outside, protect  yourself by wearing long sleeves, pants, a wide-brimmed hat, and sunglasses.  What should I know about osteoporosis? Osteoporosis is a condition in which bone destruction happens more quickly than new bone creation. After menopause, you may be at an increased risk for osteoporosis. To help prevent osteoporosis or the bone fractures that can happen because of osteoporosis, the following is recommended:  If you are 28-75 years old, get at least 1,000 mg of calcium and at least 600 mg of vitamin D per day.  If you are older than age 68 but younger than age 29, get at least 1,200 mg of calcium and at least 600 mg of vitamin D per day.  If you are older than age 99, get at least 26,200  mg of calcium and at least 800 mg of vitamin D per day.  Smoking and excessive alcohol intake increase the risk of osteoporosis. Eat foods that are rich in calcium and vitamin D, and do weight-bearing exercises several times each week as directed by your health care provider. What should I know about how menopause affects my mental health? Depression may occur at any age, but it is more common as you become older. Common symptoms of depression include:  Low or sad mood.  Changes in sleep patterns.  Changes in appetite or eating patterns.  Feeling an overall lack of motivation or enjoyment of activities that you previously enjoyed.  Frequent crying spells.  Talk with your health care provider if you think that you are experiencing depression. What should I know about immunizations? It is important that you get and maintain your immunizations. These include:  Tetanus, diphtheria, and pertussis (Tdap) booster vaccine.  Influenza every year before the flu season begins.  Pneumonia vaccine.  Shingles vaccine.  Your health care provider may also recommend other immunizations. This information is not intended to replace advice given to you by your health care provider. Make sure you discuss any questions you  have with your health care provider. Document Released: 04/08/2005 Document Revised: 09/04/2015 Document Reviewed: 11/18/2014 Elsevier Interactive Patient Education  2018 [employer].

## 2017-12-12 ENCOUNTER — Encounter: Payer: Self-pay | Admitting: *Deleted

## 2017-12-12 ENCOUNTER — Telehealth: Payer: Self-pay | Admitting: *Deleted

## 2017-12-12 NOTE — Telephone Encounter (Signed)
Received a referral for initial lung cancer screening scan.  Contacted the patient and obtained their smoking history, former smoker quit 2006 with 7.75 pkyr history on and off smoker with less than 0.25 ppd on average   as well as answering questions related to screening process.  Patient denies signs of lung cancer such as weight loss or hemoptysis at this time.  Patient denies comorbidity that would prevent curative treatment if lung cancer were found.  Patient does NOT qualify for the scan and is made aware of this, voiced understanding.  Arnett NP notified.

## 2017-12-13 NOTE — Procedures (Signed)
EMG & NCV Findings: Evaluation of the right median motor nerve showed decreased conduction velocity (Elbow-Wrist, 45 m/s).  The right median (across palm) sensory nerve showed prolonged distal peak latency (Wrist, 4.2 ms) and prolonged distal peak latency (Palm, 2.4 ms).  All remaining nerves (as indicated in the following tables) were within normal limits.    All examined muscles (as indicated in the following table) showed no evidence of electrical instability.    Impression: The above electrodiagnostic study is ABNORMAL and reveals evidence of a moderate right median nerve entrapment at the wrist (carpal tunnel syndrome) affecting sensory and motor components.   There is no significant electrodiagnostic evidence of any other focal nerve entrapment, brachial plexopathy or cervical radiculopathy.   Recommendations: 1.  Follow-up with referring physician. 2.  Continue current management of symptoms. 3.  Continue use of resting splint at night-time and as needed during the day. 4.  Suggest surgical evaluation.  ___________________________ Laurence Spates FAAPMR Board Certified, American Board of Physical Medicine and Rehabilitation    Nerve Conduction Studies Anti Sensory Summary Table   Stim Site NR Peak (ms) Norm Peak (ms) P-T Amp (V) Norm P-T Amp Site1 Site2 Delta-P (ms) Dist (cm) Vel (m/s) Norm Vel (m/s)  Right Median Acr Palm Anti Sensory (2nd Digit)  32.4C  Wrist    *4.2 <3.6 36.7 >10 Wrist Palm 1.8 0.0    Palm    *2.4 <2.0 27.0         Right Radial Anti Sensory (Base 1st Digit)  31.5C  Wrist    2.5 <3.1 23.3  Wrist Base 1st Digit 2.5 0.0    Right Ulnar Anti Sensory (5th Digit)  31.6C  Wrist    3.7 <3.7 32.9 >15.0 Wrist 5th Digit 3.7 14.0 38 >38   Motor Summary Table   Stim Site NR Onset (ms) Norm Onset (ms) O-P Amp (mV) Norm O-P Amp Site1 Site2 Delta-0 (ms) Dist (cm) Vel (m/s) Norm Vel (m/s)  Right Median Motor (Abd Poll Brev)  31.3C  Wrist    4.1 <4.2 7.9 >5 Elbow Wrist  4.3 19.5 *45 >50  Elbow    8.4  2.7         Right Ulnar Motor (Abd Dig Min)  31.3C  Wrist    3.6 <4.2 11.8 >3 B Elbow Wrist 2.9 18.0 62 >53  B Elbow    6.5  11.3  A Elbow B Elbow 1.2 9.0 75 >53  A Elbow    7.7  11.1          EMG   Side Muscle Nerve Root Ins Act Fibs Psw Amp Dur Poly Recrt Int Fraser Din Comment  Right Abd Poll Brev Median C8-T1 Nml Nml Nml Nml Nml 0 Nml Nml   Right 1stDorInt Ulnar C8-T1 Nml Nml Nml Nml Nml 0 Nml Nml   Right PronatorTeres Median C6-7 Nml Nml Nml Nml Nml 0 Nml Nml   Right Biceps Musculocut C5-6 Nml Nml Nml Nml Nml 0 Nml Nml   Right Deltoid Axillary C5-6 Nml Nml Nml Nml Nml 0 Nml Nml     Nerve Conduction Studies Anti Sensory Left/Right Comparison   Stim Site L Lat (ms) R Lat (ms) L-R Lat (ms) L Amp (V) R Amp (V) L-R Amp (%) Site1 Site2 L Vel (m/s) R Vel (m/s) L-R Vel (m/s)  Median Acr Palm Anti Sensory (2nd Digit)  32.4C  Wrist  *4.2   36.7  Wrist Palm     Palm  *2.4   27.0  Radial Anti Sensory (Base 1st Digit)  31.5C  Wrist  2.5   23.3  Wrist Base 1st Digit     Ulnar Anti Sensory (5th Digit)  31.6C  Wrist  3.7   32.9  Wrist 5th Digit  38    Motor Left/Right Comparison   Stim Site L Lat (ms) R Lat (ms) L-R Lat (ms) L Amp (mV) R Amp (mV) L-R Amp (%) Site1 Site2 L Vel (m/s) R Vel (m/s) L-R Vel (m/s)  Median Motor (Abd Poll Brev)  31.3C  Wrist  4.1   7.9  Elbow Wrist  *45   Elbow  8.4   2.7        Ulnar Motor (Abd Dig Min)  31.3C  Wrist  3.6   11.8  B Elbow Wrist  62   B Elbow  6.5   11.3  A Elbow B Elbow  75   A Elbow  7.7   11.1           Waveforms:

## 2017-12-13 NOTE — Progress Notes (Signed)
Kristen Sandoval - 62 y.o. female MRN [id number]  Date of birth: [birth year]  Office Visit Note: Visit Date: 12/06/2017 PCP: Burnard Hawthorne, FNP Referred by: Burnard Hawthorne, FNP  Subjective: Chief Complaint  Patient presents with  . Right Hand - Pain, Numbness  . Right Wrist - Pain   HPI: Kristen Sandoval is a 62 y.o. female who comes in today At the request of G. Alphonzo Severance for electrodiagnostic study of the right upper limb.  She is right-hand dominant and complaining of chronic worsening right hand and wrist pain as well as numbness and tingling in the thumb middle ring and fifth digit.  Symptoms have been ongoing for a couple of months.  She is feeling like she is dropping things and cannot grab items at times.  She reports any activity using the hand makes it worse especially typing and at night symptoms seem to be worse.  She does have nocturnal complaints.  Some numbness while driving and using the phone.  She reports nothing so far is really helped her symptoms at all.  She rates her pain as an 8 out of 10.  No prior electrodiagnostic study to review.  ROS Otherwise per HPI.  Assessment & Plan: Visit Diagnoses:  1. Paresthesia of skin     Plan: Impression: The above electrodiagnostic study is ABNORMAL and reveals evidence of a moderate right median nerve entrapment at the wrist (carpal tunnel syndrome) affecting sensory and motor components.   There is no significant electrodiagnostic evidence of any other focal nerve entrapment, brachial plexopathy or cervical radiculopathy.   Recommendations: 1.  Follow-up with referring physician. 2.  Continue current management of symptoms. 3.  Continue use of resting splint at night-time and as needed during the day. 4.  Suggest surgical evaluation.    Meds & Orders: No orders of the defined types were placed in this encounter.   Orders Placed This Encounter  Procedures  . NCV with EMG (electromyography)    Follow-up: Return  for  Anderson Malta, M.D..   Procedures: No procedures performed  EMG & NCV Findings: Evaluation of the right median motor nerve showed decreased conduction velocity (Elbow-Wrist, 45 m/s).  The right median (across palm) sensory nerve showed prolonged distal peak latency (Wrist, 4.2 ms) and prolonged distal peak latency (Palm, 2.4 ms).  All remaining nerves (as indicated in the following tables) were within normal limits.    All examined muscles (as indicated in the following table) showed no evidence of electrical instability.    Impression: The above electrodiagnostic study is ABNORMAL and reveals evidence of a moderate right median nerve entrapment at the wrist (carpal tunnel syndrome) affecting sensory and motor components.   There is no significant electrodiagnostic evidence of any other focal nerve entrapment, brachial plexopathy or cervical radiculopathy.   Recommendations: 1.  Follow-up with referring physician. 2.  Continue current management of symptoms. 3.  Continue use of resting splint at night-time and as needed during the day. 4.  Suggest surgical evaluation.  ___________________________ Laurence Spates FAAPMR Board Certified, American Board of Physical Medicine and Rehabilitation    Nerve Conduction Studies Anti Sensory Summary Table   Stim Site NR Peak (ms) Norm Peak (ms) P-T Amp (V) Norm P-T Amp Site1 Site2 Delta-P (ms) Dist (cm) Vel (m/s) Norm Vel (m/s)  Right Median Acr Palm Anti Sensory (2nd Digit)  32.4C  Wrist    *4.2 <3.6 36.7 >10 Wrist Palm 1.8 0.0    Palm    *  2.4 <2.0 27.0         Right Radial Anti Sensory (Base 1st Digit)  31.5C  Wrist    2.5 <3.1 23.3  Wrist Base 1st Digit 2.5 0.0    Right Ulnar Anti Sensory (5th Digit)  31.6C  Wrist    3.7 <3.7 32.9 >15.0 Wrist 5th Digit 3.7 14.0 38 >38   Motor Summary Table   Stim Site NR Onset (ms) Norm Onset (ms) O-P Amp (mV) Norm O-P Amp Site1 Site2 Delta-0 (ms) Dist (cm) Vel (m/s) Norm Vel (m/s)  Right Median  Motor (Abd Poll Brev)  31.3C  Wrist    4.1 <4.2 7.9 >5 Elbow Wrist 4.3 19.5 *45 >50  Elbow    8.4  2.7         Right Ulnar Motor (Abd Dig Min)  31.3C  Wrist    3.6 <4.2 11.8 >3 B Elbow Wrist 2.9 18.0 62 >53  B Elbow    6.5  11.3  A Elbow B Elbow 1.2 9.0 75 >53  A Elbow    7.7  11.1          EMG   Side Muscle Nerve Root Ins Act Fibs Psw Amp Dur Poly Recrt Int Fraser Din Comment  Right Abd Poll Brev Median C8-T1 Nml Nml Nml Nml Nml 0 Nml Nml   Right 1stDorInt Ulnar C8-T1 Nml Nml Nml Nml Nml 0 Nml Nml   Right PronatorTeres Median C6-7 Nml Nml Nml Nml Nml 0 Nml Nml   Right Biceps Musculocut C5-6 Nml Nml Nml Nml Nml 0 Nml Nml   Right Deltoid Axillary C5-6 Nml Nml Nml Nml Nml 0 Nml Nml     Nerve Conduction Studies Anti Sensory Left/Right Comparison   Stim Site L Lat (ms) R Lat (ms) L-R Lat (ms) L Amp (V) R Amp (V) L-R Amp (%) Site1 Site2 L Vel (m/s) R Vel (m/s) L-R Vel (m/s)  Median Acr Palm Anti Sensory (2nd Digit)  32.4C  Wrist  *4.2   36.7  Wrist Palm     Palm  *2.4   27.0        Radial Anti Sensory (Base 1st Digit)  31.5C  Wrist  2.5   23.3  Wrist Base 1st Digit     Ulnar Anti Sensory (5th Digit)  31.6C  Wrist  3.7   32.9  Wrist 5th Digit  38    Motor Left/Right Comparison   Stim Site L Lat (ms) R Lat (ms) L-R Lat (ms) L Amp (mV) R Amp (mV) L-R Amp (%) Site1 Site2 L Vel (m/s) R Vel (m/s) L-R Vel (m/s)  Median Motor (Abd Poll Brev)  31.3C  Wrist  4.1   7.9  Elbow Wrist  *45   Elbow  8.4   2.7        Ulnar Motor (Abd Dig Min)  31.3C  Wrist  3.6   11.8  B Elbow Wrist  62   B Elbow  6.5   11.3  A Elbow B Elbow  75   A Elbow  7.7   11.1           Waveforms:            Clinical History: MRI OF THE RIGHT FINGERS WITHOUT CONTRAST  TECHNIQUE: Multiplanar, multisequence MR imaging of the right thumb was performed. No intravenous contrast was administered.  COMPARISON:  None.  FINDINGS: Bones/Joint/Cartilage  No fracture or dislocation. Normal  alignment.  Extensive full-thickness cartilage loss of first [facility] joint  with severe subchondral marrow edema and partial fragmentation of the radial aspect of the trapezoid. Moderate first CMC joint effusion.  Moderate joint space narrowing and cartilage loss of scaphotrapeziotrapezoid joint with subchondral reactive marrow changes.  Partial-thickness cartilage loss of the first MCP joint and first IP joint with small marginal osteophytes.  Partial-thickness cartilage loss of the third MCP joint with a small joint effusion.  No aggressive osseous lesion.  Ligaments  Collateral ligaments are intact.  Muscles and Tendons Flexor compartment tendons are intact. Mild tendinosis of the extensor carpi ulnaris with peripheral subluxation as can be seen with subsheath injury. Remainder the extensor compartment tendons are intact. Muscles are normal.  Soft tissue No fluid collection or hematoma.  No soft tissue mass.  IMPRESSION: 1. 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 Moderate joint effusion. 2. Moderate osteoarthritis of the scaphotrapeziotrapezoid joint with subchondral reactive marrow changes. 3. Partial-thickness cartilage loss of the first MCP joint and first IP joint with small marginal osteophytes. 4. Partial-thickness cartilage loss of the third MCP joint with a small joint effusion. 5. Mild tendinosis of the extensor carpi ulnaris with peripheral subluxation as can be seen with subsheath injury.   Electronically Signed   By: Kathreen Devoid   On: 11/30/2017 16:25   She reports that she quit smoking about 11 years ago. She has a 7.75 pack-year smoking history. She has never used smokeless tobacco. No results for input(s): HGBA1C, LABURIC in the last 8760 hours.  Objective:  VS:  HT:    WT:   BMI:     BP:   HR: bpm  TEMP: ( )  RESP:  Physical Exam  Constitutional: She is oriented to person, place, and time.  Musculoskeletal:   Inspection reveals no atrophy of the bilateral APB or FDI or hand intrinsics. There is no swelling, color changes, allodynia or dystrophic changes. There is 5 out of 5 strength in the bilateral wrist extension, finger abduction and long finger flexion. There is intact sensation to light touch in all dermatomal and peripheral nerve distributions.  There is a negative Tinel's test at the bilateral wrist and elbow. There is an equivocally positive Phalen's test on the right. There is a negative Hoffmann's test bilaterally.  Neurological: She is alert and oriented to person, place, and time. She exhibits normal muscle tone. Coordination normal.  Skin: Skin is warm and dry. No erythema.    Ortho Exam Imaging: No results found.  Past Medical/Family/Surgical/Social History: Medications & Allergies reviewed per EMR, new medications updated. Patient Active Problem List   Diagnosis Date Noted  . Bronchitis 05/15/2017  . Routine physical examination 10/18/2016  . Screening for breast cancer 10/18/2016  . Anxiety 02/10/2015  . Osteopenia 02/10/2015  . 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 Nonspecific abnormal finding in stool contents 02/10/2015  . 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 Avitaminosis D 09/02/2007  . Acid reflux 04/01/2007  . 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 Anxiety and depression 10/20/2006  . 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 Hypercholesterolemia without hypertriglyceridemia 10/20/2006   Past Medical History:  Diagnosis Date  . Allergy   . Anxiety   . Arthritis   . Asthma   . GERD (gastroesophageal reflux disease)    Family History  Adopted: Yes  Problem Relation Age of Onset  . Lung cancer Mother    Past Surgical History:  Procedure Laterality Date  . ABDOMINAL HYSTERECTOMY  1990's   NO cervix on exam  . KNEE SURGERY  Left 09/2009  . OOPHORECTOMY Bilateral 1990's  . TONSILLECTOMY AND ADENOIDECTOMY     Social History   Occupational  History  . Not on file  Tobacco Use  . Smoking status: Former Smoker    Packs/day: 0.25    Years: 31.00    Pack years: 7.75    Last attempt to quit: 02/27/2006    Years since quitting: 11.8  . Smokeless tobacco: Never Used  . Tobacco comment: 2006  Substance and Sexual Activity  . Alcohol use: Yes    Alcohol/week: 0.0 standard drinks    Comment: Moderate use  . Drug use: No  . Sexual activity: Not on file

## 2017-12-14 ENCOUNTER — Other Ambulatory Visit: Payer: 59

## 2017-12-15 ENCOUNTER — Encounter (INDEPENDENT_AMBULATORY_CARE_PROVIDER_SITE_OTHER): Payer: Self-pay | Admitting: Orthopedic Surgery

## 2017-12-15 ENCOUNTER — Telehealth (INDEPENDENT_AMBULATORY_CARE_PROVIDER_SITE_OTHER): Payer: Self-pay

## 2017-12-15 ENCOUNTER — Ambulatory Visit (INDEPENDENT_AMBULATORY_CARE_PROVIDER_SITE_OTHER): Payer: Self-pay

## 2017-12-15 ENCOUNTER — Ambulatory Visit (INDEPENDENT_AMBULATORY_CARE_PROVIDER_SITE_OTHER): Payer: 59 | Admitting: Orthopedic Surgery

## 2017-12-15 DIAGNOSIS — M1811 Unilateral primary osteoarthritis of first carpometacarpal joint, right hand: Secondary | ICD-10-CM

## 2017-12-15 DIAGNOSIS — M25531 Pain in right wrist: Secondary | ICD-10-CM

## 2017-12-15 MED ORDER — BUPIVACAINE HCL 0.25 % IJ SOLN
0.3300 mL | INTRAMUSCULAR | Status: AC | PRN
  Administered 2017-12-15: .33 mL via INTRA_ARTICULAR

## 2017-12-15 MED ORDER — METHYLPREDNISOLONE ACETATE 40 MG/ML IJ SUSP
13.3300 mg | INTRAMUSCULAR | Status: AC | PRN
  Administered 2017-12-15: 13.33 mg via INTRA_ARTICULAR

## 2017-12-15 MED ORDER — LIDOCAINE HCL 1 % IJ SOLN
3.0000 mL | INTRAMUSCULAR | Status: AC | PRN
  Administered 2017-12-15: 3 mL

## 2017-12-15 NOTE — Telephone Encounter (Signed)
Made note and closed

## 2017-12-15 NOTE — Progress Notes (Signed)
Office Visit Note   Patient: Kristen Sandoval           Date of Birth: 02-16-1956           MRN: [id number] Visit Date: 12/15/2017 Requested by: Burnard Hawthorne, FNP [address] [place], [place] [address] PCP: Burnard Hawthorne, FNP  Subjective: Chief Complaint  Patient presents with  . Follow-up    review scans    HPI: Kabao is a patient with right hand pain.  Since I have last seen her she is had an MRI scan of the thumb as well as EMG nerve study.  The nerve study demonstrates moderate carpal tunnel syndrome.  MRI scan shows significant osteoarthritis at the [facility] joint.  The patient is very fitness oriented and likes to work out.  She does describe some loss of dexterity.  She also reports some difficulty with fine motor skills in her hand..  Patient is right-hand dominant.              ROS: All systems reviewed are negative as they relate to the chief complaint within the history of present illness.  Patient denies  fevers or chills.   Assessment & Plan: Visit Diagnoses:  1. Pain in right wrist   2. Primary osteoarthritis of first carpometacarpal joint of right hand     Plan: Impression is moderate right carpal tunnel syndrome which is symptomatic.  Patient is having paresthesias and loss of dexterity.  More significant however is the pain she is having at the [facility] joint with pinching.  She likes to work out with [employer].  I think this can be difficult for her to do until she gets this addressed surgically.  I meant to get her up with a volar wrist splint.  Also injection into the [facility] joint is performed today.  I would like to send her to Dr. Burney Gauze for evaluation for simultaneous carpal tunnel release and CMC arthroplasty.  I think that would be a good indication her case for surgical fixation of both problems at once.  I will see her back as needed  Follow-Up Instructions: Return if symptoms worsen or fail to improve.   Orders:  Orders Placed This Encounter    Procedures  . XR C-ARM NO REPORT  . Ambulatory referral to Orthopedic Surgery   No orders of the defined types were placed in this encounter.     Procedures: Small Joint Inj: R thumb CMC on 12/15/2017 5:59 PM Indications: pain Details: 25 G needle, fluoroscopy-guided radial approach  Spinal Needle: No  Medications: 3 mL lidocaine 1 %; 0.33 mL bupivacaine 0.25 %; 13.33 mg methylPREDNISolone acetate 40 MG/ML Outcome: tolerated well, no immediate complications Procedure, treatment alternatives, risks and benefits explained, specific risks discussed. Consent was given by the patient. Immediately prior to procedure a time out was called to verify the correct patient, procedure, equipment, support staff and site/side marked as required. Patient was prepped and draped in the usual sterile fashion.       Clinical Data: No additional findings.  Objective: Vital Signs: There were no vitals taken for this visit.  Physical Exam:   Constitutional: Patient appears well-developed HEENT:  Head: Normocephalic Eyes:EOM are normal Neck: Normal range of motion Cardiovascular: Normal rate Pulmonary/chest: Effort normal Neurologic: Patient is alert Skin: Skin is warm Psychiatric: Patient has normal mood and affect    Ortho Exam: Ortho exam demonstrates full active and passive range of motion of the wrist and elbow on the  right-hand side.  She does have intact EPL FPL function.  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  Positive carpal tunnel compression testing on the right but negative Tinel's in the cubital tunnel.  She has positive grind test on the right.  Wynn Maudlin is negative.  Specialty Comments:  No specialty comments available.  Imaging: Xr C-arm No Report  Result Date: 12/15/2017 Please see Notes tab for imaging impression.    PMFS History: Patient Active Problem List   Diagnosis Date Noted  . Bronchitis 05/15/2017  . Routine physical examination  10/18/2016  . Screening for breast cancer 10/18/2016  . Anxiety 02/10/2015  . Osteopenia 02/10/2015  . 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 Nonspecific abnormal finding in stool contents 02/10/2015  . 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 Avitaminosis D 09/02/2007  . Acid reflux 04/01/2007  . 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 Anxiety and depression 10/20/2006  . 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 Hypercholesterolemia without hypertriglyceridemia 10/20/2006   Past Medical History:  Diagnosis Date  . Allergy   . Anxiety   . Arthritis   . Asthma   . GERD (gastroesophageal reflux disease)     Family History  Adopted: Yes  Problem Relation Age of Onset  . Lung cancer Mother     Past Surgical History:  Procedure Laterality Date  . ABDOMINAL HYSTERECTOMY  1990's   NO cervix on exam  . KNEE SURGERY Left 09/2009  . OOPHORECTOMY Bilateral 1990's  . TONSILLECTOMY AND ADENOIDECTOMY     Social History   Occupational History  . Not on file  Tobacco Use  . Smoking status: Former Smoker    Packs/day: 0.25    Years: 31.00    Pack years: 7.75    Last attempt to quit: 02/27/2006    Years since quitting: 11.8  . Smokeless tobacco: Never Used  . Tobacco comment: 2006  Substance and Sexual Activity  . Alcohol use: Yes    Alcohol/week: 0.0 standard drinks    Comment: Moderate use  . Drug use: No  . Sexual activity: Not on file

## 2017-12-15 NOTE — Telephone Encounter (Signed)
This referral is done. Just unable to edit . All information faxed and appt scheduled for 10/22 @ 3pm

## 2017-12-19 DIAGNOSIS — M1811 Unilateral primary osteoarthritis of first carpometacarpal joint, right hand: Secondary | ICD-10-CM | POA: Diagnosis not present

## 2017-12-19 DIAGNOSIS — G5601 Carpal tunnel syndrome, right upper limb: Secondary | ICD-10-CM | POA: Insufficient documentation

## 2017-12-22 ENCOUNTER — Other Ambulatory Visit (INDEPENDENT_AMBULATORY_CARE_PROVIDER_SITE_OTHER): Payer: 59

## 2017-12-22 DIAGNOSIS — Z0000 Encounter for general adult medical examination without abnormal findings: Secondary | ICD-10-CM

## 2017-12-22 LAB — COMPREHENSIVE METABOLIC PANEL
ALBUMIN: 4.5 g/dL (ref 3.5–5.2)
ALT: 16 U/L (ref 0–35)
AST: 20 U/L (ref 0–37)
Alkaline Phosphatase: 80 U/L (ref 39–117)
BILIRUBIN TOTAL: 0.6 mg/dL (ref 0.2–1.2)
BUN: 12 mg/dL (ref 6–23)
CO2: 26 meq/L (ref 19–32)
CREATININE: 0.67 mg/dL (ref 0.40–1.20)
Calcium: 9.5 mg/dL (ref 8.4–10.5)
Chloride: 104 mEq/L (ref 96–112)
GFR: 95.4 mL/min (ref >60.00)
Glucose, Bld: 90 mg/dL (ref 70–99)
Potassium: 4.3 mEq/L (ref 3.5–5.1)
SODIUM: 141 meq/L (ref 135–145)
Total Protein: 7.6 g/dL (ref 6.0–8.3)

## 2017-12-22 LAB — CBC WITH DIFFERENTIAL/PLATELET
BASOS ABS: 0 10*3/uL (ref 0.0–0.1)
BASOS PCT: 0.7 % (ref 0.0–3.0)
EOS ABS: 0.2 10*3/uL (ref 0.0–0.7)
Eosinophils Relative: 4.1 % (ref 0.0–5.0)
HCT: 40.7 % (ref 36.0–46.0)
Hemoglobin: 13.8 g/dL (ref 12.0–15.0)
Lymphocytes Relative: 29.2 % (ref 12.0–46.0)
Lymphs Abs: 1.1 10*3/uL (ref 0.7–4.0)
MCHC: 34 g/dL (ref 30.0–36.0)
MCV: 91.8 fl (ref 78.0–100.0)
MONO ABS: 0.3 10*3/uL (ref 0.1–1.0)
Monocytes Relative: 7.5 % (ref 3.0–12.0)
NEUTROS ABS: 2.2 10*3/uL (ref 1.4–7.7)
NEUTROS PCT: 58.5 % (ref 43.0–77.0)
PLATELETS: 250 10*3/uL (ref 150.0–400.0)
RBC: 4.43 Mil/uL (ref 3.87–5.11)
RDW: 13 % (ref 11.5–15.5)
WBC: 3.8 10*3/uL — ABNORMAL LOW (ref 4.0–10.5)

## 2017-12-22 LAB — VITAMIN D 25 HYDROXY (VIT D DEFICIENCY, FRACTURES): VITD: 35.04 ng/mL (ref 30.00–100.00)

## 2017-12-22 LAB — LIPID PANEL
Cholesterol: 250 mg/dL — ABNORMAL HIGH (ref 0–200)
HDL: 97.7 mg/dL (ref >39.00)
LDL Cholesterol: 137 mg/dL — ABNORMAL HIGH (ref 0–99)
NonHDL: 152.79
TRIGLYCERIDES: 77 mg/dL (ref 0.0–149.0)
Total CHOL/HDL Ratio: 3
VLDL: 15.4 mg/dL (ref 0.0–40.0)

## 2017-12-22 LAB — TSH: TSH: 1.57 u[IU]/mL (ref 0.35–4.50)

## 2017-12-22 LAB — HEMOGLOBIN A1C: Hgb A1c MFr Bld: 5.6 % (ref 4.6–6.5)

## 2017-12-25 ENCOUNTER — Other Ambulatory Visit: Payer: Self-pay | Admitting: Family

## 2017-12-25 DIAGNOSIS — R7989 Other specified abnormal findings of blood chemistry: Secondary | ICD-10-CM

## 2018-01-01 ENCOUNTER — Ambulatory Visit
Admission: RE | Admit: 2018-01-01 | Discharge: 2018-01-01 | Disposition: A | Discharge status: Home / Self Care | Payer: 59 | Source: Ambulatory Visit | Attending: Family | Admitting: Family

## 2018-01-01 DIAGNOSIS — Z0000 Encounter for general adult medical examination without abnormal findings: Secondary | ICD-10-CM | POA: Insufficient documentation

## 2018-01-01 DIAGNOSIS — Z1231 Encounter for screening mammogram for malignant neoplasm of breast: Secondary | ICD-10-CM | POA: Diagnosis not present

## 2018-01-05 DIAGNOSIS — G5601 Carpal tunnel syndrome, right upper limb: Secondary | ICD-10-CM | POA: Diagnosis not present

## 2018-01-09 ENCOUNTER — Other Ambulatory Visit (INDEPENDENT_AMBULATORY_CARE_PROVIDER_SITE_OTHER): Payer: 59

## 2018-01-09 DIAGNOSIS — R7989 Other specified abnormal findings of blood chemistry: Secondary | ICD-10-CM | POA: Diagnosis not present

## 2018-01-09 LAB — CBC WITH DIFFERENTIAL/PLATELET
BASOS PCT: 0.5 % (ref 0.0–3.0)
Basophils Absolute: 0 10*3/uL (ref 0.0–0.1)
EOS PCT: 4.5 % (ref 0.0–5.0)
Eosinophils Absolute: 0.3 10*3/uL (ref 0.0–0.7)
HCT: 40 % (ref 36.0–46.0)
HEMOGLOBIN: 13.6 g/dL (ref 12.0–15.0)
LYMPHS ABS: 1.9 10*3/uL (ref 0.7–4.0)
Lymphocytes Relative: 31.7 % (ref 12.0–46.0)
MCHC: 34 g/dL (ref 30.0–36.0)
MCV: 90.8 fl (ref 78.0–100.0)
Monocytes Absolute: 0.5 10*3/uL (ref 0.1–1.0)
Monocytes Relative: 8.8 % (ref 3.0–12.0)
Neutro Abs: 3.2 10*3/uL (ref 1.4–7.7)
Neutrophils Relative %: 54.5 % (ref 43.0–77.0)
Platelets: 268 10*3/uL (ref 150.0–400.0)
RBC: 4.41 Mil/uL (ref 3.87–5.11)
RDW: 12.8 % (ref 11.5–15.5)
WBC: 5.9 10*3/uL (ref 4.0–10.5)

## 2018-01-14 DIAGNOSIS — Z23 Encounter for immunization: Secondary | ICD-10-CM | POA: Diagnosis not present

## 2018-05-31 DIAGNOSIS — M1811 Unilateral primary osteoarthritis of first carpometacarpal joint, right hand: Secondary | ICD-10-CM | POA: Diagnosis not present

## 2018-11-07 ENCOUNTER — Ambulatory Visit: Payer: 59 | Admitting: Family

## 2018-11-12 ENCOUNTER — Encounter: Payer: Self-pay | Admitting: Family

## 2018-11-12 ENCOUNTER — Telehealth: Payer: Self-pay

## 2018-11-12 ENCOUNTER — Ambulatory Visit (INDEPENDENT_AMBULATORY_CARE_PROVIDER_SITE_OTHER): Payer: 59 | Admitting: Family

## 2018-11-12 VITALS — Ht 61.0 in | Wt 126.0 lb

## 2018-11-12 DIAGNOSIS — F419 Anxiety disorder, unspecified: Secondary | ICD-10-CM

## 2018-11-12 DIAGNOSIS — M79641 Pain in right hand: Secondary | ICD-10-CM | POA: Diagnosis not present

## 2018-11-12 DIAGNOSIS — J4520 Mild intermittent asthma, uncomplicated: Secondary | ICD-10-CM

## 2018-11-12 DIAGNOSIS — Z1239 Encounter for other screening for malignant neoplasm of breast: Secondary | ICD-10-CM

## 2018-11-12 DIAGNOSIS — M79642 Pain in left hand: Secondary | ICD-10-CM

## 2018-11-12 DIAGNOSIS — F329 Major depressive disorder, single episode, unspecified: Secondary | ICD-10-CM

## 2018-11-12 DIAGNOSIS — F32A Depression, unspecified: Secondary | ICD-10-CM

## 2018-11-12 MED ORDER — MELOXICAM 7.5 MG PO TABS: 7.5000 mg | ORAL_TABLET | Freq: Every day | ORAL | 1 refills | Status: DC | PRN

## 2018-11-12 MED ORDER — DICLOFENAC SODIUM 1 % TD GEL: 4.0000 g | Freq: Four times a day (QID) | TRANSDERMAL | 1 refills | Status: DC

## 2018-11-12 NOTE — Progress Notes (Signed)
This visit type was conducted due to national recommendations for restrictions regarding the COVID-19 pandemic (e.g. social distancing).  This format is felt to be most appropriate for this patient at this time.  All issues noted in this document were discussed and addressed.  No physical exam was performed (except for noted visual exam findings with Video Visits).  Virtual Visit via Video Note  I connected with@  on 11/14/18 at  3:00 PM EDT by a video enabled telemedicine application and verified that I am speaking with the correct person using two identifiers.  Location patient: home Location provider:work  Persons participating in the virtual visit: patient, provider  I discussed the limitations of evaluation and management by telemedicine and the availability of in person appointments. The patient expressed understanding and agreed to proceed.   HPI:  CC: Complaining of bilateral hands in DIP's,  For past 6 months.   Worse depending activity that day such as exercise, typing. Hard to grasp. Numbness in 2nd, 3rd , and 4th finger bilaterally. No increased warmth , redness. Believes tried mobic in the past.   H/o  Carpal tunnel; h/o right carpal tunnel release fall 2019; injected given 05/2018 Dr Burney Gauze. Pain improved after release.  Also having knee pain bilateral.  H/o knee surgery. No swelling, falls, giving way, increased heat or redness of knees.    Following with Dr Burney Gauze  Adopted, so unsure if family has significant autoimmune history.   Breathing well. No wheezing, sob. Only using advair as needed. Think will need Advair more in cold weather.   Feeling well on zoloft. Likes medication and dose.   ROS: See pertinent positives and negatives per HPI.  Past Medical History:  Diagnosis Date  . Allergy   . Anxiety   . Arthritis   . Asthma   . GERD (gastroesophageal reflux disease)     Past Surgical History:  Procedure Laterality Date  . ABDOMINAL HYSTERECTOMY   1990's   NO cervix on exam  . KNEE SURGERY Left 09/2009  . OOPHORECTOMY Bilateral 1990's  . TONSILLECTOMY AND ADENOIDECTOMY      Family History  Adopted: Yes  Problem Relation Age of Onset  . Lung cancer Mother     SOCIAL HX: former smoker   Current Outpatient Medications:  .  ADVAIR DISKUS 100-50 MCG/DOSE AEPB, INHALE 1 PUFF TWICE A DAY AS DIRECTED **RINSE MOUTH AFTER USE**, Disp: 60 each, Rfl: 3 .  fluticasone (FLONASE) 50 MCG/ACT nasal spray, Place 1 spray into both nostrils daily., Disp: 16 g, Rfl: 5 .  sertraline (ZOLOFT) 50 MG tablet, TAKE TWO TABLETS BY MOUTH EVERY DAY (Patient taking differently: 50 mg. ), Disp: 180 tablet, Rfl: 1 .  diclofenac sodium (VOLTAREN) 1 % GEL, Apply 4 g topically 4 (four) times daily., Disp: 50 g, Rfl: 1 .  meloxicam (MOBIC) 7.5 MG tablet, Take 1 tablet (7.5 mg total) by mouth daily as needed for pain., Disp: 30 tablet, Rfl: 1 .  VENTOLIN HFA 108 (90 Base) MCG/ACT inhaler, INHALE 2 PUFFS INTO LUNGS EVERY 6 HOURS AS NEEDED FOR WHEEZING OR SHORTNESS OF BREATH, Disp: 18 g, Rfl: 3  EXAM:  VITALS per patient if applicable:  GENERAL: alert, oriented, appears well and in no acute distress  HEENT: atraumatic, conjunttiva clear, no obvious abnormalities on inspection of external nose and ears  NECK: normal movements of the head and neck  LUNGS: on inspection no signs of respiratory distress, breathing rate appears normal, no obvious gross SOB, gasping or wheezing  CV: no obvious cyanosis  MS: moves all visible extremities without noticeable abnormality  PSYCH/NEURO: pleasant and cooperative, no obvious depression or anxiety, speech and thought processing grossly intact  ASSESSMENT AND PLAN:  Discussed the following assessment and plan: Problem List Items Addressed This Visit      Respiratory   Airway hyperreactivity    Symptoms well controlled at this time; continue current regimen.         Other   Anxiety and depression    Doing well  on zoloft; will continue      Screening for breast cancer   Relevant Orders   MM 3D SCREEN BREAST BILATERAL   Bilateral hand pain - Primary    Symptoms consistent with OA, would expect PIP / MCP involvement with RA.  Declines autoimmune labs, Xrays at this time and would prefer to treat conservatively.Trial of mobic. Education provided on medication and how to safely use. She will let me know how she is doing.       Relevant Medications   meloxicam (MOBIC) 7.5 MG tablet   diclofenac sodium (VOLTAREN) 1 % GEL     Mammogram ordered and patient understands to schedule.    -we discussed possible serious and likely etiologies, options for evaluation and workup, limitations of telemedicine visit vs in person visit, treatment, treatment risks and precautions. Pt prefers to treat via telemedicine empirically rather then risking or undertaking an in person visit at this moment. Patient agrees to seek prompt in person care if worsening, new symptoms arise, or if is not improving with treatment.   I discussed the assessment and treatment plan with the patient. The patient was provided an opportunity to ask questions and all were answered. The patient agreed with the plan and demonstrated an understanding of the instructions.   The patient was advised to call back or seek an in-person evaluation if the symptoms worsen or if the condition fails to improve as anticipated.   Mable Paris, FNP

## 2018-11-12 NOTE — Telephone Encounter (Signed)
Patient was checked in at 3:01 pm for her 3:00 pm appointment.

## 2018-11-12 NOTE — Telephone Encounter (Signed)
Copied from [place] [id number]. Topic: General - Other >> Nov 12, 2018  2:15 PM Leward Quan A wrote: Reason for CRM: Patient called as a reminder of her virtual visit scheduled for 3.00 pm asking that she is not overlooked

## 2018-11-12 NOTE — Assessment & Plan Note (Addendum)
Symptoms consistent with OA, would expect PIP / MCP involvement with RA.  Declines autoimmune labs, Xrays at this time and would prefer to treat conservatively.Trial of mobic. Education provided on medication and how to safely use. She will let me know how she is doing.

## 2018-11-12 NOTE — Patient Instructions (Addendum)
Trial of mobic  A couple of points in regards to meloxicam ( Mobic) which you are on.   This medication is not intended for daily , long term use. It is a potent anti inflammatory ( NSAID), and my intention is for you take as needed for moderate to severe pain. If you find yourself using daily, please let me know.   Please takes Mobic ( meloxicam) with FOOD since it is an anti-inflammatory as it can cause a GI bleed or ulcer. If you have a history of GI bleed or ulcer, please do NOT take.  Do no take over the counter aleve, motrin, advil, goody's powder for pain as they are also NSAIDs, and they are  in the same class as Mobic  Lastly, we will need to monitor kidney function while on Mobic wer, and if we were to see any decline in kidney function in the future, we would have to discontinue this medication.    Please call call and schedule your 3D mammogram as discussed.   [place]  [place], [place]

## 2018-11-14 ENCOUNTER — Encounter: Payer: Self-pay | Admitting: Family

## 2018-11-14 NOTE — Assessment & Plan Note (Signed)
Doing well on zoloft; will continue

## 2018-11-14 NOTE — Assessment & Plan Note (Signed)
Symptoms well controlled at this time; continue current regimen.

## 2018-12-15 IMAGING — MR MR [PERSON_NAME]*[PERSON_NAME]* W/O CM
4 of 5 series · 17 of 40 positions shown · non-contrast
Comparison: None.

CLINICAL DATA: Right thumb pain for several months.

EXAM:
MRI OF THE RIGHT FINGERS WITHOUT CONTRAST
TECHNIQUE: Multiplanar, multisequence MR imaging of the right thumb was
performed. No intravenous contrast was administered.

[Series 4: T2 fat-sat · axial · 4.0mm · 0.39mm/px · z∈[-59,+34]mm · 3 of 31 slices shown (1 of 2)]
[im 4/31]
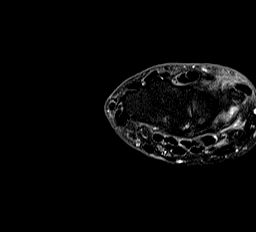
[im 17/31]
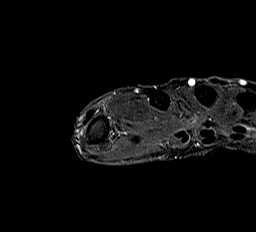
[im 27/31]
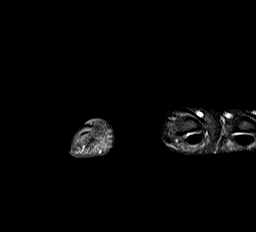

[Series 5: PD fat-sat · oblique · 2.0mm · 0.20mm/px · 7 of 20 slices shown (1 of 2)]
[im 1/20]
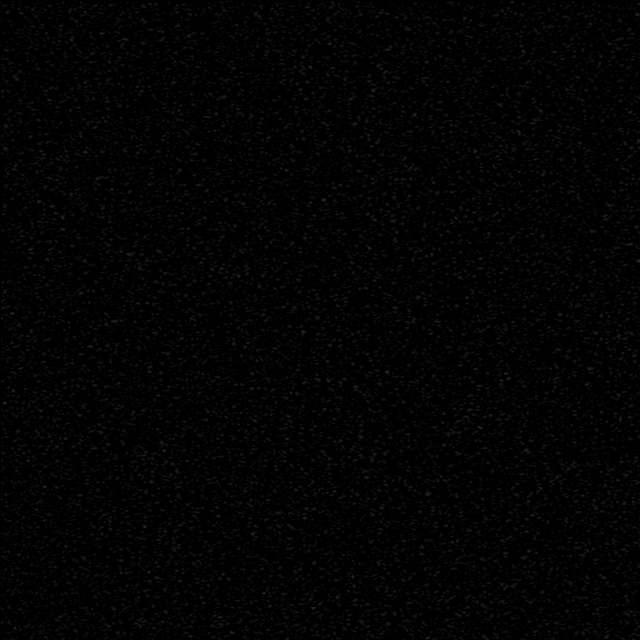
[im 4/20]
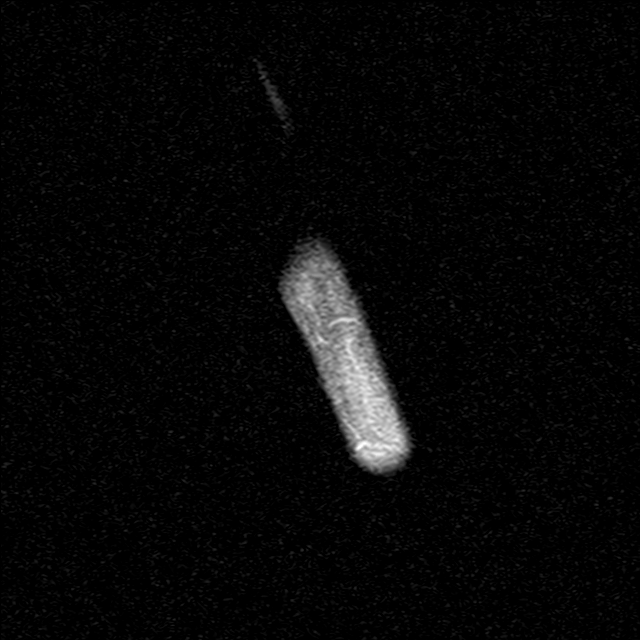
[im 7/20]
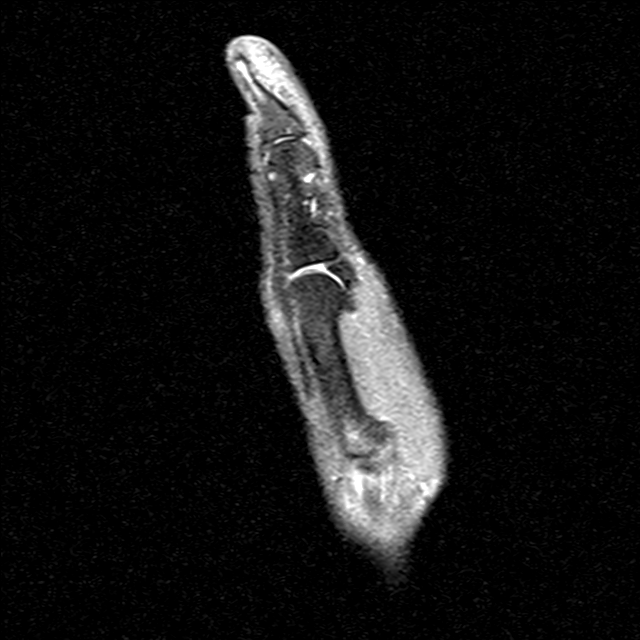
[im 10/20]
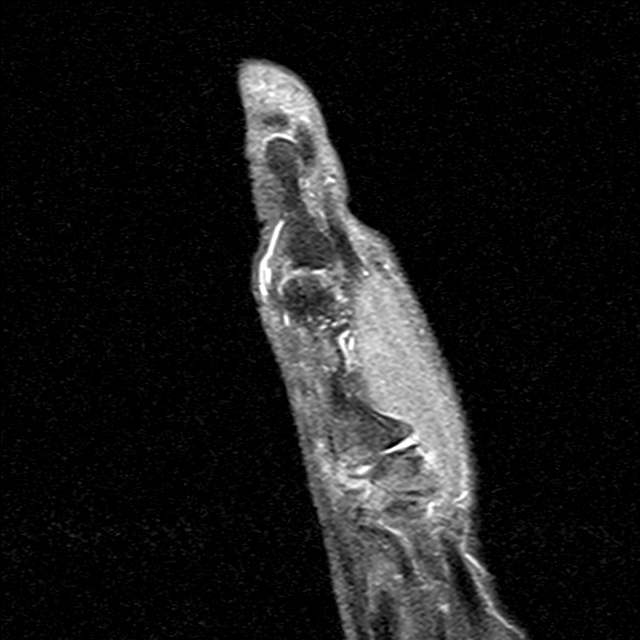
[im 13/20]
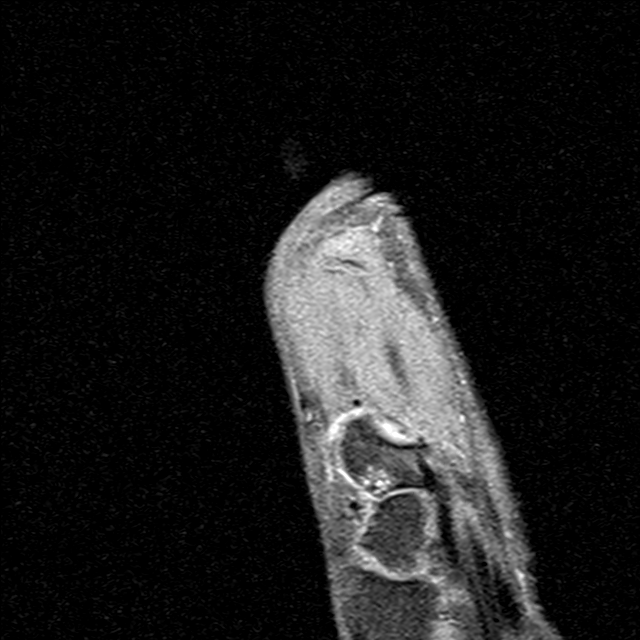
[im 16/20]
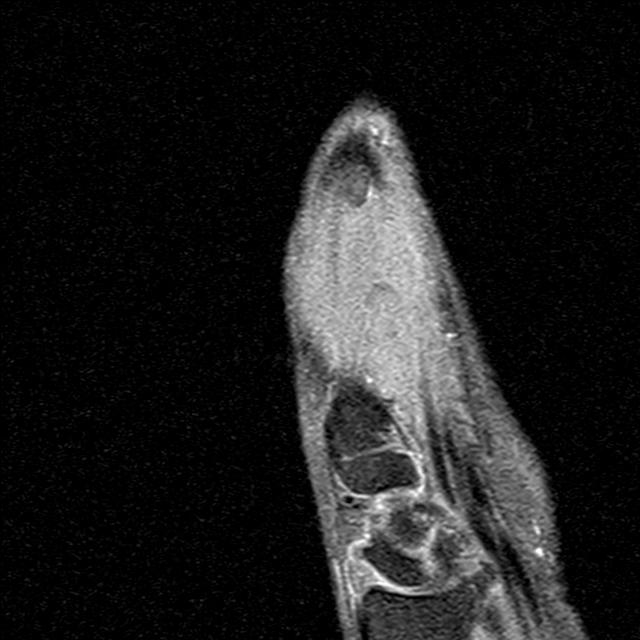
[im 20/20]
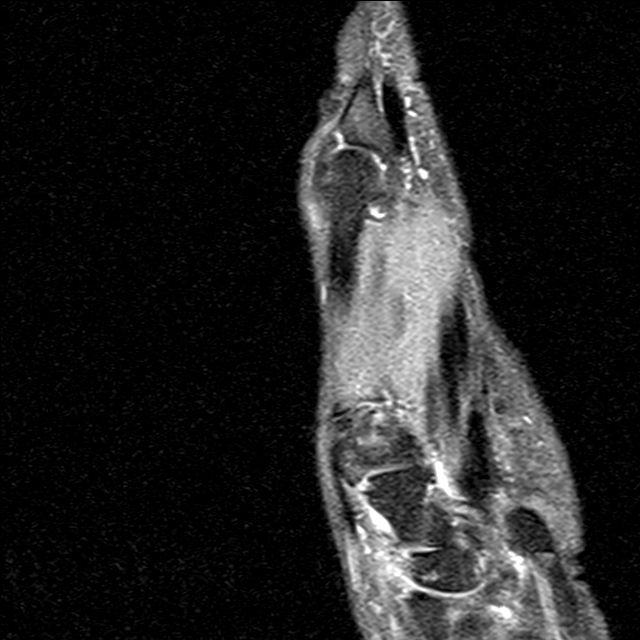

[Series 100: PD fat-sat · coronal · 2.0mm · 0.19mm/px · 4 of 20 slices shown (2 of 2)]
[im 1/20]
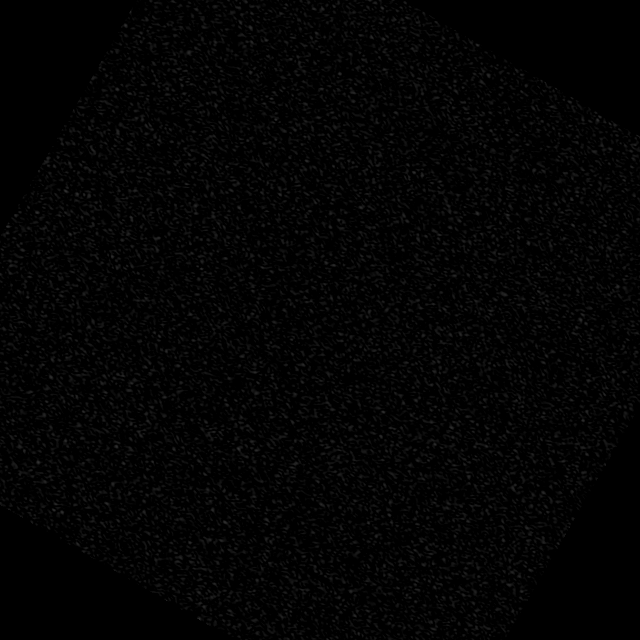
[im 4/20]
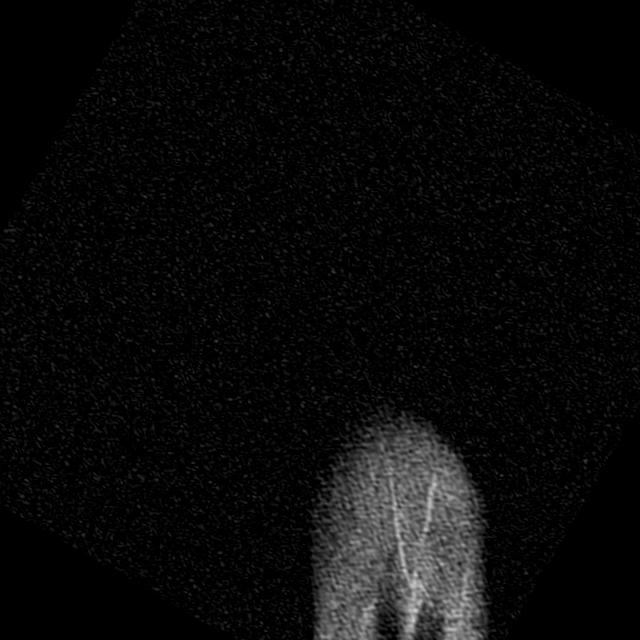
[im 10/20]
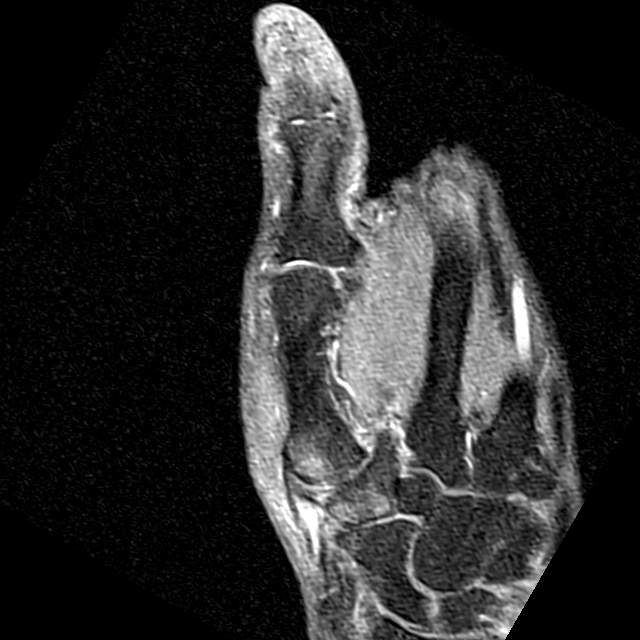
[im 16/20]
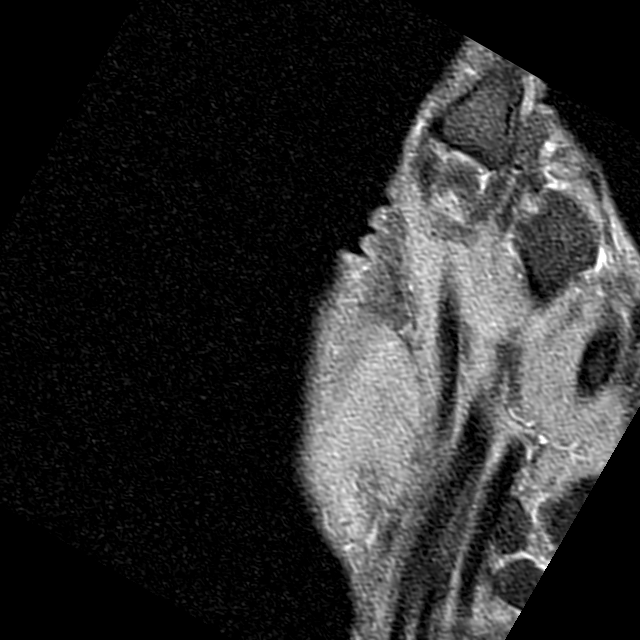

[Series 101: T2 fat-sat · coronal · 2.0mm · 0.47mm/px · 3 of 20 slices shown (2 of 2)]
[im 4/20]
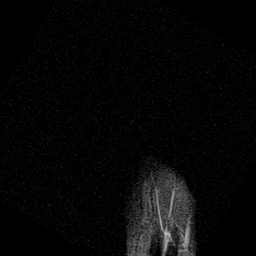
[im 10/20]
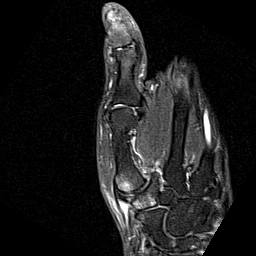
[im 16/20]
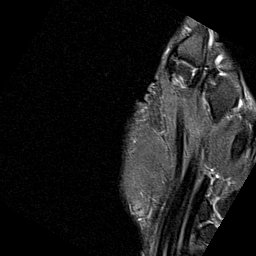

[17 of 40 positions shown; findings below may reference images not displayed]

FINDINGS: Bones/Joint/Cartilage

No fracture or dislocation. Normal alignment.

Extensive full-thickness cartilage loss of first CMC joint with
severe subchondral marrow edema and partial fragmentation of the
radial aspect of the trapezoid. Moderate first CMC joint effusion.

Moderate joint space narrowing and cartilage loss of
scaphotrapeziotrapezoid joint with subchondral reactive marrow
changes.

Partial-thickness cartilage loss of the first MCP joint and first IP
joint with small marginal osteophytes.

Partial-thickness cartilage loss of the third MCP joint with a small
joint effusion.

No aggressive osseous lesion.

Ligaments

Collateral ligaments are intact.

Muscles and Tendons
Flexor compartment tendons are intact. Mild tendinosis of the
extensor carpi ulnaris with peripheral subluxation as can be seen
with subsheath injury. Remainder the extensor compartment tendons
are intact. Muscles are normal.

Soft tissue
No fluid collection or hematoma.  No soft tissue mass.
IMPRESSION: 1. Severe advanced osteoarthritis of the first CMC joint. Moderate
joint effusion.
2. Moderate osteoarthritis of the scaphotrapeziotrapezoid joint with
subchondral reactive marrow changes.
3. Partial-thickness cartilage loss of the first MCP joint and first
IP joint with small marginal osteophytes.
4. Partial-thickness cartilage loss of the third MCP joint with a
small joint effusion.
5. Mild tendinosis of the extensor carpi ulnaris with peripheral
subluxation as can be seen with subsheath injury.

## 2018-12-17 ENCOUNTER — Encounter: Payer: 59 | Admitting: Family

## 2019-01-03 ENCOUNTER — Ambulatory Visit
Admission: RE | Admit: 2019-01-03 | Discharge: 2019-01-03 | Disposition: A | Discharge status: Home / Self Care | Payer: 59 | Source: Ambulatory Visit | Attending: Family | Admitting: Family

## 2019-01-03 DIAGNOSIS — Z1239 Encounter for other screening for malignant neoplasm of breast: Secondary | ICD-10-CM

## 2019-01-03 DIAGNOSIS — Z1231 Encounter for screening mammogram for malignant neoplasm of breast: Secondary | ICD-10-CM | POA: Diagnosis not present

## 2019-02-15 ENCOUNTER — Telehealth: Payer: Self-pay | Admitting: Family

## 2019-02-15 DIAGNOSIS — Z0000 Encounter for general adult medical examination without abnormal findings: Secondary | ICD-10-CM

## 2019-02-15 NOTE — Telephone Encounter (Signed)
Patient has a virtual cpe on 12/30 and would like to have labs before appt. There are no orders, does patient need labs?

## 2019-02-15 NOTE — Telephone Encounter (Signed)
Call patient Labs ordered, please schedule labs

## 2019-02-18 ENCOUNTER — Other Ambulatory Visit: Payer: Self-pay

## 2019-02-25 ENCOUNTER — Other Ambulatory Visit (INDEPENDENT_AMBULATORY_CARE_PROVIDER_SITE_OTHER): Payer: BC Managed Care – PPO

## 2019-02-25 ENCOUNTER — Other Ambulatory Visit: Payer: Self-pay

## 2019-02-25 DIAGNOSIS — Z0000 Encounter for general adult medical examination without abnormal findings: Secondary | ICD-10-CM

## 2019-02-25 LAB — CBC WITH DIFFERENTIAL/PLATELET
Basophils Absolute: 0 10*3/uL (ref 0.0–0.1)
Basophils Relative: 0.5 % (ref 0.0–3.0)
Eosinophils Absolute: 0.1 10*3/uL (ref 0.0–0.7)
Eosinophils Relative: 2.4 % (ref 0.0–5.0)
HCT: 40 % (ref 36.0–46.0)
Hemoglobin: 13.4 g/dL (ref 12.0–15.0)
Lymphocytes Relative: 30.7 % (ref 12.0–46.0)
Lymphs Abs: 1.5 10*3/uL (ref 0.7–4.0)
MCHC: 33.6 g/dL (ref 30.0–36.0)
MCV: 92.1 fl (ref 78.0–100.0)
Monocytes Absolute: 0.3 10*3/uL (ref 0.1–1.0)
Monocytes Relative: 6.1 % (ref 3.0–12.0)
Neutro Abs: 2.9 10*3/uL (ref 1.4–7.7)
Neutrophils Relative %: 60.3 % (ref 43.0–77.0)
Platelets: 241 10*3/uL (ref 150.0–400.0)
RBC: 4.35 Mil/uL (ref 3.87–5.11)
RDW: 12.9 % (ref 11.5–15.5)
WBC: 4.9 10*3/uL (ref 4.0–10.5)

## 2019-02-25 LAB — LIPID PANEL
Cholesterol: 268 mg/dL — ABNORMAL HIGH (ref 0–200)
HDL: 103.8 mg/dL (ref >39.00)
LDL Cholesterol: 149 mg/dL — ABNORMAL HIGH (ref 0–99)
NonHDL: 163.83
Total CHOL/HDL Ratio: 3
Triglycerides: 74 mg/dL (ref 0.0–149.0)
VLDL: 14.8 mg/dL (ref 0.0–40.0)

## 2019-02-25 LAB — COMPREHENSIVE METABOLIC PANEL
ALT: 31 U/L (ref 0–35)
AST: 36 U/L (ref 0–37)
Albumin: 4.5 g/dL (ref 3.5–5.2)
Alkaline Phosphatase: 83 U/L (ref 39–117)
BUN: 13 mg/dL (ref 6–23)
CO2: 27 mEq/L (ref 19–32)
Calcium: 9.4 mg/dL (ref 8.4–10.5)
Chloride: 101 mEq/L (ref 96–112)
Creatinine, Ser: 0.73 mg/dL (ref 0.40–1.20)
GFR: 80.98 mL/min (ref >60.00)
Glucose, Bld: 92 mg/dL (ref 70–99)
Potassium: 4.5 mEq/L (ref 3.5–5.1)
Sodium: 135 mEq/L (ref 135–145)
Total Bilirubin: 0.6 mg/dL (ref 0.2–1.2)
Total Protein: 7.4 g/dL (ref 6.0–8.3)

## 2019-02-25 LAB — VITAMIN D 25 HYDROXY (VIT D DEFICIENCY, FRACTURES): VITD: 20.85 ng/mL — ABNORMAL LOW (ref 30.00–100.00)

## 2019-02-25 LAB — HEMOGLOBIN A1C: Hgb A1c MFr Bld: 5.8 % (ref 4.6–6.5)

## 2019-02-25 LAB — TSH: TSH: 1.18 u[IU]/mL (ref 0.35–4.50)

## 2019-02-27 ENCOUNTER — Encounter: Payer: Self-pay | Admitting: Family

## 2019-02-27 ENCOUNTER — Other Ambulatory Visit: Payer: Self-pay | Admitting: Family

## 2019-02-27 ENCOUNTER — Ambulatory Visit (INDEPENDENT_AMBULATORY_CARE_PROVIDER_SITE_OTHER): Payer: BC Managed Care – PPO | Admitting: Family

## 2019-02-27 ENCOUNTER — Other Ambulatory Visit: Payer: Self-pay

## 2019-02-27 VITALS — Ht 61.0 in | Wt 126.0 lb

## 2019-02-27 DIAGNOSIS — F32A Depression, unspecified: Secondary | ICD-10-CM

## 2019-02-27 DIAGNOSIS — F419 Anxiety disorder, unspecified: Secondary | ICD-10-CM

## 2019-02-27 DIAGNOSIS — M79642 Pain in left hand: Secondary | ICD-10-CM

## 2019-02-27 DIAGNOSIS — M79641 Pain in right hand: Secondary | ICD-10-CM

## 2019-02-27 DIAGNOSIS — F329 Major depressive disorder, single episode, unspecified: Secondary | ICD-10-CM

## 2019-02-27 DIAGNOSIS — J4520 Mild intermittent asthma, uncomplicated: Secondary | ICD-10-CM | POA: Diagnosis not present

## 2019-02-27 MED ORDER — SERTRALINE HCL 50 MG PO TABS: 50.0000 mg | ORAL_TABLET | Freq: Every day | ORAL | 1 refills | Status: DC

## 2019-02-27 MED ORDER — TRAZODONE HCL 50 MG PO TABS: 25.0000 mg | ORAL_TABLET | Freq: Every evening | ORAL | 3 refills | Status: DC | PRN

## 2019-02-27 NOTE — Progress Notes (Signed)
Virtual Visit via Video Note  I connected with@  on 02/27/19 at  8:30 AM EST by a video enabled telemedicine application and verified that I am speaking with the correct person using two identifiers.  Location patient: home Location provider:work  Persons participating in the virtual visit: patient, provider  I discussed the limitations of evaluation and management by telemedicine and the availability of in person appointments. The patient expressed understanding and agreed to proceed.   HPI: Feels well.   Doing well on 50mg  zoloft, Complains of not sleeping through the night. Would like to try a medication for this.Falls asleep okay.   Hand pain- has taken mobic and diclofenac prn with improvement. Last saw Dr Burney Gauze Arpil 2020. Declines Xrays, labs at this time.   During [employer]. No CP, SOB.   UTD mammogram    ROS: See pertinent positives and negatives per HPI.  Past Medical History:  Diagnosis Date  . Allergy   . Anxiety   . Arthritis   . Asthma   . GERD (gastroesophageal reflux disease)     Past Surgical History:  Procedure Laterality Date  . ABDOMINAL HYSTERECTOMY  1990's   NO cervix on exam  . KNEE SURGERY Left 09/2009  . OOPHORECTOMY Bilateral 1990's  . TONSILLECTOMY AND ADENOIDECTOMY      Family History  Adopted: Yes  Problem Relation Age of Onset  . Lung cancer Mother     SOCIAL HX: former   Current Outpatient Medications:  .  ADVAIR DISKUS 100-50 MCG/DOSE AEPB, INHALE 1 PUFF TWICE A DAY AS DIRECTED **RINSE MOUTH AFTER USE**, Disp: 60 each, Rfl: 3 .  diclofenac sodium (VOLTAREN) 1 % GEL, Apply 4 g topically 4 (four) times daily., Disp: 50 g, Rfl: 1 .  fluticasone (FLONASE) 50 MCG/ACT nasal spray, Place 1 spray into both nostrils daily., Disp: 16 g, Rfl: 5 .  meloxicam (MOBIC) 7.5 MG tablet, Take 1 tablet (7.5 mg total) by mouth daily as needed for pain., Disp: 30 tablet, Rfl: 1 .  sertraline (ZOLOFT) 50 MG tablet, Take 1 tablet (50 mg total) by  mouth at bedtime., Disp: 90 tablet, Rfl: 1 .  VENTOLIN HFA 108 (90 Base) MCG/ACT inhaler, INHALE 2 PUFFS INTO LUNGS EVERY 6 HOURS AS NEEDED FOR WHEEZING OR SHORTNESS OF BREATH, Disp: 18 g, Rfl: 3 .  traZODone (DESYREL) 50 MG tablet, Take 0.5-1 tablets (25-50 mg total) by mouth at bedtime as needed for sleep., Disp: 30 tablet, Rfl: 3  EXAM:  VITALS per patient if applicable:  GENERAL: alert, oriented, appears well and in no acute distress  HEENT: atraumatic, conjunttiva clear, no obvious abnormalities on inspection of external nose and ears  NECK: normal movements of the head and neck  LUNGS: on inspection no signs of respiratory distress, breathing rate appears normal, no obvious gross SOB, gasping or wheezing  CV: no obvious cyanosis  MS: moves all visible extremities without noticeable abnormality  PSYCH/NEURO: pleasant and cooperative, no obvious depression or anxiety, speech and thought processing grossly intact  ASSESSMENT AND PLAN:  Discussed the following assessment and plan:  Anxiety - Plan: traZODone (DESYREL) 50 MG tablet, sertraline (ZOLOFT) 50 MG tablet  Bilateral hand pain  Mild intermittent asthma without complication  Anxiety and depression Problem List Items Addressed This Visit      Respiratory   Airway hyperreactivity     Other   Anxiety - Primary   Relevant Medications   traZODone (DESYREL) 50 MG tablet   sertraline (ZOLOFT) 50 MG tablet  Anxiety and depression    Doing well on zoloft, will adjunct with trazodone. She will let me know how she is doing.       Relevant Medications   traZODone (DESYREL) 50 MG tablet   sertraline (ZOLOFT) 50 MG tablet   Bilateral hand pain    Unchanged.  Symptom improvement with meloxicam, Voltaren gel, will continue. declines further evaluation at this time.          -we discussed possible serious and likely etiologies, options for evaluation and workup, limitations of telemedicine visit vs in person visit,  treatment, treatment risks and precautions. Pt prefers to treat via telemedicine empirically rather then risking or undertaking an in person visit at this moment. Patient agrees to seek prompt in person care if worsening, new symptoms arise, or if is not improving with treatment.   I discussed the assessment and treatment plan with the patient. The patient was provided an opportunity to ask questions and all were answered. The patient agreed with the plan and demonstrated an understanding of the instructions.   The patient was advised to call back or seek an in-person evaluation if the symptoms worsen or if the condition fails to improve as anticipated.   Mable Paris, FNP

## 2019-02-27 NOTE — Assessment & Plan Note (Addendum)
Unchanged.  Symptom improvement with meloxicam, Voltaren gel, will continue. declines further evaluation at this time.

## 2019-02-27 NOTE — Assessment & Plan Note (Signed)
Doing well on zoloft, will adjunct with trazodone. She will let me know how she is doing.

## 2019-04-05 ENCOUNTER — Ambulatory Visit (INDEPENDENT_AMBULATORY_CARE_PROVIDER_SITE_OTHER)
Admission: RE | Admit: 2019-04-05 | Discharge: 2019-04-05 | Disposition: A | Discharge status: Home / Self Care | Payer: BC Managed Care – PPO | Source: Ambulatory Visit

## 2019-04-05 DIAGNOSIS — J0100 Acute maxillary sinusitis, unspecified: Secondary | ICD-10-CM | POA: Diagnosis not present

## 2019-04-05 MED ORDER — CETIRIZINE HCL 10 MG PO TABS: 10.0000 mg | ORAL_TABLET | Freq: Every day | ORAL | 0 refills | Status: DC

## 2019-04-05 MED ORDER — AZITHROMYCIN 250 MG PO TABS: 250.0000 mg | ORAL_TABLET | Freq: Every day | ORAL | 0 refills | Status: DC

## 2019-04-05 NOTE — Discharge Instructions (Addendum)
Continue the Flonase and add zyrtec daily.  If symptoms worsen you can start the antibiotic.

## 2019-04-05 NOTE — ED Provider Notes (Signed)
Virtual Visit via Video Note:  KHRISTEN GARIBALDO  initiated request for Telemedicine visit with [facility] team. I connected with Kristen Sandoval  on 04/05/2019 at 9:15 AM  for a synchronized telemedicine visit using a video enabled HIPPA compliant telemedicine application. I verified that I am speaking with Kristen Sandoval  using two identifiers. Orvan July, NP  was physically located in a [facility]gent care site and JOYCLYN KRAHL was located at a different location.   The limitations of evaluation and management by telemedicine as well as the availability of in-person appointments were discussed. Patient was informed that she  may incur a bill ( including co-pay) for this virtual visit encounter. Kristen Sandoval  expressed understanding and gave verbal consent to proceed with virtual visit.     History of Present Illness:Kristen Sandoval  is a 64 y.o. female presents with nasal congestion, mucous, ear fullness over  the past few days. Worse on the right. She has been taking mucinex  starting today. Seemed to help some. No fever, chill, cough, chest congestion, sore throat, body aches. No recent sick contacts or COVID exposure.   Past Medical History:  Diagnosis Date   Allergy    Anxiety    Arthritis    Asthma    GERD (gastroesophageal reflux disease)     Allergies  Allergen Reactions   Clarithromycin Itching   Erythromycin     vomiting   Latex         Observations/Objective: VITALS: Per patient if applicable, see vitals. GENERAL: Alert, appears well and in no acute distress. HEENT: Atraumatic, conjunctiva clear, no obvious abnormalities on inspection of external nose and ears. NECK: Normal movements of the head and neck. CARDIOPULMONARY: No increased WOB. Speaking in clear sentences. I:E ratio WNL.  MS: Moves all visible extremities without noticeable abnormality. PSYCH: Pleasant and cooperative, well-groomed. Speech normal rate and rhythm. Affect is appropriate. Insight and  judgement are appropriate. Attention is focused, linear, and appropriate.  NEURO: CN grossly intact. Oriented as arrived to appointment on time with no prompting. Moves both UE equally.  SKIN: No obvious lesions, wounds, erythema, or cyanosis noted on face or hands.     Assessment and Plan:sinusitis-treating with Flonase and zyrtec.  Recommended try this for a few more days and if symptoms worsen she can start the abx.    Follow Up Instructions:Follow up as needed for continued or worsening symptoms     I discussed the assessment and treatment plan with the patient. The patient was provided an opportunity to ask questions and all were answered. The patient agreed with the plan and demonstrated an understanding of the instructions.   The patient was advised to call back or seek an in-person evaluation if the symptoms worsen or if the condition fails to improve as anticipated.   Orvan July, NP  04/05/2019 9:15 AM         Orvan July, NP 04/07/19 1144

## 2019-04-25 ENCOUNTER — Other Ambulatory Visit: Payer: Self-pay | Admitting: Family

## 2019-04-25 DIAGNOSIS — Z0000 Encounter for general adult medical examination without abnormal findings: Secondary | ICD-10-CM

## 2019-07-05 ENCOUNTER — Encounter: Payer: BC Managed Care – PPO | Admitting: Family

## 2019-07-26 ENCOUNTER — Other Ambulatory Visit: Payer: Self-pay

## 2019-07-26 ENCOUNTER — Encounter: Payer: Self-pay | Admitting: Family

## 2019-07-26 ENCOUNTER — Ambulatory Visit (INDEPENDENT_AMBULATORY_CARE_PROVIDER_SITE_OTHER): Payer: BC Managed Care – PPO | Admitting: Family

## 2019-07-26 VITALS — BP 150/90 | HR 80 | Temp 96.8°F | Ht 62.0 in | Wt 120.6 lb

## 2019-07-26 DIAGNOSIS — Z87898 Personal history of other specified conditions: Secondary | ICD-10-CM

## 2019-07-26 DIAGNOSIS — R030 Elevated blood-pressure reading, without diagnosis of hypertension: Secondary | ICD-10-CM | POA: Diagnosis not present

## 2019-07-26 DIAGNOSIS — Z0000 Encounter for general adult medical examination without abnormal findings: Secondary | ICD-10-CM | POA: Diagnosis not present

## 2019-07-26 NOTE — Assessment & Plan Note (Signed)
Revisited this prior diagnosis today while reviewing history.no vision changes, HA. Declines repeat MRI brain or further surveillance by  referral to endocrine.

## 2019-07-26 NOTE — Assessment & Plan Note (Signed)
Slightly elevated.  Patient will keep blood pressure log and call us with readings.

## 2019-07-26 NOTE — Progress Notes (Signed)
Subjective:    Patient ID: Kristen Sandoval, female    DOB: 10/30/1955, 64 y.o.   MRN: [id number]  CC: Kristen Sandoval is a 64 y.o. female who presents today for physical exam.    HPI: Feels well today. No complaints.   Depression- doing well on zoloft and dose. Hasnt  Been using trazodone. No si/hi  Asthma- not having to use the advair, uses albuterol prior to [employer] work out. NO wheezing, sob.   No formal history of hypertension.  Never been on any hypertensive agents.  No NSAID use. No cp.   H/o pituitary tumor- treated with medication, no surgery. no vision changes, HA. Declines repeat MRI brain.   Colorectal Cancer Screening: UTD in 2012 (unable to see records) done in [place] to return in 10 years. Normal bowel movements.  Breast Cancer Screening: Mammogram UTD. Declines breast exam today.  Cervical Cancer Screening: History of hysterectomy for noncancerous reasons, ovarian cyst and menorrhagia.  No cervix. No vaginal bleeding.   Bone Health screening/DEXA for 65+: No increased fracture risk. Defer screening at this time. Lung Cancer Screening: Doesn't have 30 year pack year history and age > 77 years. Immunizations       Tetanus -  UTD  Labs: Screening labs today. Exercise: Gets regular exercise with [employer].  Alcohol use: occasional Smoking/tobacco use: former smoker.    HISTORY:  Past Medical History:  Diagnosis Date  . Allergy   . Anxiety   . Arthritis   . Asthma   . GERD (gastroesophageal reflux disease)     Past Surgical History:  Procedure Laterality Date  . ABDOMINAL HYSTERECTOMY  1990's    for noncancerous reasons, ovarian cyst and menorrhagia; NO cervix on exam  . KNEE SURGERY Left 09/2009  . OOPHORECTOMY Bilateral 1990's  . TONSILLECTOMY AND ADENOIDECTOMY     Family History  Adopted: Yes  Problem Relation Age of Onset  . Lung cancer Mother       ALLERGIES: Clarithromycin, Erythromycin, and Latex  Current Outpatient Medications on File  Prior to Visit  Medication Sig Dispense Refill  . ADVAIR DISKUS 100-50 MCG/DOSE AEPB INHALE 1 PUFF TWICE A DAY AS DIRECTED **RINSE MOUTH AFTER USE** 60 each 3  . azithromycin (ZITHROMAX Z-PAK) 250 MG tablet Take 1 tablet (250 mg total) by mouth daily. Take first 2 tablets together, then 1 every day until finished. 6 tablet 0  . cetirizine (ZYRTEC) 10 MG tablet Take 1 tablet (10 mg total) by mouth daily. 30 tablet 0  . diclofenac sodium (VOLTAREN) 1 % GEL Apply 4 g topically 4 (four) times daily. 50 g 1  . fluticasone (FLONASE) 50 MCG/ACT nasal spray TAKE 1 SPRAY INTO BOTH NOSTRILS EVERY DAY 16 g 1  . meloxicam (MOBIC) 7.5 MG tablet Take 1 tablet (7.5 mg total) by mouth daily as needed for pain. 30 tablet 1  . sertraline (ZOLOFT) 50 MG tablet TAKE TWO TABLETS BY MOUTH EVERY DAY 180 tablet 0  . VENTOLIN HFA 108 (90 Base) MCG/ACT inhaler INHALE 2 PUFFS INTO LUNGS EVERY 6 HOURS AS NEEDED FOR WHEEZING OR SHORTNESS OF BREATH 18 g 3   No current facility-administered medications on file prior to visit.    Social History   Tobacco Use  . Smoking status: Former Smoker    Packs/day: 0.25    Years: 31.00    Pack years: 7.75    Quit date: 02/27/2006    Years since quitting: 13.4  . Smokeless tobacco: Never Used  .  Tobacco comment: 2006  Substance Use Topics  . Alcohol use: Yes    Alcohol/week: 0.0 standard drinks    Comment: Moderate use  . Drug use: No    Review of Systems  Constitutional: Negative for chills, fever and unexpected weight change.  HENT: Negative for congestion.   Respiratory: Negative for cough, shortness of breath and wheezing.   Cardiovascular: Negative for chest pain, palpitations and leg swelling.  Gastrointestinal: Negative for nausea and vomiting.  Genitourinary: Negative for pelvic pain.  Musculoskeletal: Negative for arthralgias and myalgias.  Skin: Negative for rash.  Neurological: Negative for headaches.  Hematological: Negative for adenopathy.    Psychiatric/Behavioral: Negative for confusion.      Objective:    BP (!) 150/90   Pulse 80   Temp (!) 96.8 F (36 C) (Temporal)   Ht 5\' 2"  (1.575 m)   Wt 120 lb 9.6 oz (54.7 kg)   SpO2 97%   BMI 22.06 kg/m   BP Readings from Last 3 Encounters:  07/26/19 (!) 150/90  12/11/17 132/80  05/15/17 138/80   Wt Readings from Last 3 Encounters:  07/26/19 120 lb 9.6 oz (54.7 kg)  02/27/19 126 lb (57.2 kg)  11/12/18 126 lb (57.2 kg)    Physical Exam Vitals reviewed.  Constitutional:      Appearance: She is well-developed.  Eyes:     Conjunctiva/sclera: Conjunctivae normal.  Cardiovascular:     Rate and Rhythm: Normal rate and regular rhythm.     Pulses: Normal pulses.     Heart sounds: Normal heart sounds.  Pulmonary:     Effort: Pulmonary effort is normal.     Breath sounds: Normal breath sounds. No wheezing, rhonchi or rales.  Skin:    General: Skin is warm and dry.  Neurological:     Mental Status: She is alert.     Sensory: No sensory deficit.  Psychiatric:        Speech: Speech normal.        Behavior: Behavior normal.        Thought Content: Thought content normal.        Assessment & Plan:   Problem List Items Addressed This Visit      Endocrine   H/O: pituitary tumor    Revisited this prior diagnosis today while reviewing history.no vision changes, HA. Declines repeat MRI brain or further surveillance by  referral to endocrine.         Other   Elevated blood pressure reading    Slightly elevated.  Patient will keep blood pressure log and call us with readings.      Routine physical examination - Primary    Declines CBE. Advised to continue CBE at home.  Declines pelvic exam today in the absence of complaints and that she does not have a cervix.  Encouraged continued exercise      Relevant Orders   Lipid panel   Hemoglobin A1c       I have discontinued Lattie Haw B. Sedgwick's traZODone. I am also having her maintain her Advair Diskus, meloxicam,  diclofenac sodium, sertraline, cetirizine, azithromycin, Ventolin HFA, and fluticasone.   No orders of the defined types were placed in this encounter.   Return precautions given.   Risks, benefits, and alternatives of the medications and treatment plan prescribed today were discussed, and patient expressed understanding.   Education regarding symptom management and diagnosis given to patient on AVS.   Continue to follow with Burnard Hawthorne, FNP for routine health maintenance.  Bubba Camp and I agreed with plan.   Mable Paris, FNP

## 2019-07-26 NOTE — Patient Instructions (Addendum)
Blood pressure is ever so increased today Please send me readings as discussed.   Monitor blood pressure at home and me 5-6 reading on separate days. Goal is less than 120/80, based on newest guidelines, however we certainly want to be less than 130/80;  if persistently higher, please make sooner follow up appointment so we can recheck you blood pressure and manage/ adjust medications.   Health Maintenance for Postmenopausal Women Menopause is a normal process in which your ability to get pregnant comes to an end. This process happens slowly over many months or years, usually between the ages of 52 and 98. Menopause is complete when you have missed your menstrual periods for 12 months. It is important to talk with your health care provider about some of the most common conditions that affect women after menopause (postmenopausal women). These include heart disease, cancer, and bone loss (osteoporosis). Adopting a healthy lifestyle and getting preventive care can help to promote your health and wellness. The actions you take can also lower your chances of developing some of these common conditions. What should I know about menopause? During menopause, you may get a number of symptoms, such as:  Hot flashes. These can be moderate or severe.  Night sweats.  Decrease in sex drive.  Mood swings.  Headaches.  Tiredness.  Irritability.  Memory problems.  Insomnia. Choosing to treat or not to treat these symptoms is a decision that you make with your health care provider. Do I need hormone replacement therapy?  Hormone replacement therapy is effective in treating symptoms that are caused by menopause, such as hot flashes and night sweats.  Hormone replacement carries certain risks, especially as you become older. If you are thinking about using estrogen or estrogen with progestin, discuss the benefits and risks with your health care provider. What is my risk for heart disease and  stroke? The risk of heart disease, heart attack, and stroke increases as you age. One of the causes may be a change in the body's hormones during menopause. This can affect how your body uses dietary fats, triglycerides, and cholesterol. Heart attack and stroke are medical emergencies. There are many things that you can do to help prevent heart disease and stroke. Watch your blood pressure  High blood pressure causes heart disease and increases the risk of stroke. This is more likely to develop in people who have high blood pressure readings, are of African descent, or are overweight.  Have your blood pressure checked: ? Every 3-5 years if you are 65-48 years of age. ? Every year if you are 16 years old or older. Eat a healthy diet   Eat a diet that includes plenty of vegetables, fruits, low-fat dairy products, and lean protein.  Do not eat a lot of foods that are high in solid fats, added sugars, or sodium. Get regular exercise Get regular exercise. This is one of the most important things you can do for your health. Most adults should:  Try to exercise for at least 150 minutes each week. The exercise should increase your heart rate and make you sweat (moderate-intensity exercise).  Try to do strengthening exercises at least twice each week. Do these in addition to the moderate-intensity exercise.  Spend less time sitting. Even light physical activity can be beneficial. Other tips  Work with your health care provider to achieve or maintain a healthy weight.  Do not use any products that contain nicotine or tobacco, such as cigarettes, e-cigarettes, and chewing tobacco. If  you need help quitting, ask your health care provider.  Know your numbers. Ask your health care provider to check your cholesterol and your blood sugar (glucose). Continue to have your blood tested as directed by your health care provider. Do I need screening for cancer? Depending on your health history and family  history, you may need to have cancer screening at different stages of your life. This may include screening for:  Breast cancer.  Cervical cancer.  Lung cancer.  Colorectal cancer. What is my risk for osteoporosis? After menopause, you may be at increased risk for osteoporosis. Osteoporosis is a condition in which bone destruction happens more quickly than new bone creation. To help prevent osteoporosis or the bone fractures that can happen because of osteoporosis, you may take the following actions:  If you are 41-39 years old, get at least 1,000 mg of calcium and at least 600 mg of vitamin D per day.  If you are older than age 36 but younger than age 68, get at least 1,200 mg of calcium and at least 600 mg of vitamin D per day.  If you are older than age 55, get at least 1,200 mg of calcium and at least 800 mg of vitamin D per day. Smoking and drinking excessive alcohol increase the risk of osteoporosis. Eat foods that are rich in calcium and vitamin D, and do weight-bearing exercises several times each week as directed by your health care provider. How does menopause affect my mental health? Depression may occur at any age, but it is more common as you become older. Common symptoms of depression include:  Low or sad mood.  Changes in sleep patterns.  Changes in appetite or eating patterns.  Feeling an overall lack of motivation or enjoyment of activities that you previously enjoyed.  Frequent crying spells. Talk with your health care provider if you think that you are experiencing depression. General instructions See your health care provider for regular wellness exams and vaccines. This may include:  Scheduling regular health, dental, and eye exams.  Getting and maintaining your vaccines. These include: ? Influenza vaccine. Get this vaccine each year before the flu season begins. ? Pneumonia vaccine. ? Shingles vaccine. ? Tetanus, diphtheria, and pertussis (Tdap) booster  vaccine. Your health care provider may also recommend other immunizations. Tell your health care provider if you have ever been abused or do not feel safe at home. Summary  Menopause is a normal process in which your ability to get pregnant comes to an end.  This condition causes hot flashes, night sweats, decreased interest in sex, mood swings, headaches, or lack of sleep.  Treatment for this condition may include hormone replacement therapy.  Take actions to keep yourself healthy, including exercising regularly, eating a healthy diet, watching your weight, and checking your blood pressure and blood sugar levels.  Get screened for cancer and depression. Make sure that you are up to date with all your vaccines. This information is not intended to replace advice given to you by your health care provider. Make sure you discuss any questions you have with your health care provider. Document Revised: 02/07/2018 Document Reviewed: 02/07/2018 Elsevier Patient Education  2020 [employer].

## 2019-07-26 NOTE — Assessment & Plan Note (Addendum)
Declines CBE. Advised to continue CBE at home.  Declines pelvic exam today in the absence of complaints and that she does not have a cervix.  Encouraged continued exercise

## 2019-07-27 LAB — HEMOGLOBIN A1C
Hgb A1c MFr Bld: 5.3 % of total Hgb (ref <5.7)
Mean Plasma Glucose: 105 (calc)
eAG (mmol/L): 5.8 (calc)

## 2019-07-27 LAB — LIPID PANEL
Cholesterol: 254 mg/dL — ABNORMAL HIGH (ref <200)
HDL: 100 mg/dL (ref >=50)
LDL Cholesterol (Calc): 132 mg/dL (calc) — ABNORMAL HIGH
Non-HDL Cholesterol (Calc): 154 mg/dL (calc) — ABNORMAL HIGH (ref <130)
Total CHOL/HDL Ratio: 2.5 (calc) (ref <5.0)
Triglycerides: 112 mg/dL (ref <150)

## 2019-07-30 ENCOUNTER — Telehealth: Payer: Self-pay

## 2019-07-30 ENCOUNTER — Encounter: Payer: Self-pay | Admitting: Family

## 2019-07-30 NOTE — Telephone Encounter (Signed)
I see that patient has viewed labs via mychart. I called though to find out how patient would like to send form? Fax, mailed or picked up? LMTCB.

## 2019-07-31 ENCOUNTER — Other Ambulatory Visit: Payer: Self-pay

## 2019-07-31 MED ORDER — AMLODIPINE BESYLATE 2.5 MG PO TABS: ORAL_TABLET | ORAL | 1 refills | Status: DC

## 2019-07-31 NOTE — Telephone Encounter (Signed)
Patient was agreeable to amlodipine PRN. I have sent the 2.5mg  to [facility] for patient.

## 2020-07-15 ENCOUNTER — Encounter: Payer: BC Managed Care – PPO | Admitting: Family

## 2020-08-25 ENCOUNTER — Encounter: Payer: Self-pay | Admitting: Family

## 2020-09-04 ENCOUNTER — Other Ambulatory Visit: Payer: Self-pay

## 2020-09-04 ENCOUNTER — Encounter: Payer: Self-pay | Admitting: Family

## 2020-09-04 ENCOUNTER — Ambulatory Visit (INDEPENDENT_AMBULATORY_CARE_PROVIDER_SITE_OTHER): Payer: BC Managed Care – PPO | Admitting: Family

## 2020-09-04 VITALS — BP 122/72 | HR 85 | Ht 61.0 in | Wt 108.8 lb

## 2020-09-04 DIAGNOSIS — F419 Anxiety disorder, unspecified: Secondary | ICD-10-CM

## 2020-09-04 DIAGNOSIS — Z1231 Encounter for screening mammogram for malignant neoplasm of breast: Secondary | ICD-10-CM

## 2020-09-04 DIAGNOSIS — J4520 Mild intermittent asthma, uncomplicated: Secondary | ICD-10-CM

## 2020-09-04 DIAGNOSIS — Z1211 Encounter for screening for malignant neoplasm of colon: Secondary | ICD-10-CM

## 2020-09-04 DIAGNOSIS — F32A Depression, unspecified: Secondary | ICD-10-CM | POA: Diagnosis not present

## 2020-09-04 DIAGNOSIS — R030 Elevated blood-pressure reading, without diagnosis of hypertension: Secondary | ICD-10-CM

## 2020-09-04 LAB — CBC WITH DIFFERENTIAL/PLATELET
Basophils Absolute: 0 10*3/uL (ref 0.0–0.1)
Basophils Relative: 0.7 % (ref 0.0–3.0)
Eosinophils Absolute: 0.1 10*3/uL (ref 0.0–0.7)
Eosinophils Relative: 1.5 % (ref 0.0–5.0)
HCT: 40.7 % (ref 36.0–46.0)
Hemoglobin: 13.7 g/dL (ref 12.0–15.0)
Lymphocytes Relative: 28.3 % (ref 12.0–46.0)
Lymphs Abs: 1.3 10*3/uL (ref 0.7–4.0)
MCHC: 33.8 g/dL (ref 30.0–36.0)
MCV: 91.6 fl (ref 78.0–100.0)
Monocytes Absolute: 0.3 10*3/uL (ref 0.1–1.0)
Monocytes Relative: 5.7 % (ref 3.0–12.0)
Neutro Abs: 3 10*3/uL (ref 1.4–7.7)
Neutrophils Relative %: 63.8 % (ref 43.0–77.0)
Platelets: 242 10*3/uL (ref 150.0–400.0)
RBC: 4.44 Mil/uL (ref 3.87–5.11)
RDW: 13.6 % (ref 11.5–15.5)
WBC: 4.7 10*3/uL (ref 4.0–10.5)

## 2020-09-04 LAB — COMPREHENSIVE METABOLIC PANEL
ALT: 15 U/L (ref 0–35)
AST: 22 U/L (ref 0–37)
Albumin: 4.5 g/dL (ref 3.5–5.2)
Alkaline Phosphatase: 82 U/L (ref 39–117)
BUN: 18 mg/dL (ref 6–23)
CO2: 25 mEq/L (ref 19–32)
Calcium: 9.3 mg/dL (ref 8.4–10.5)
Chloride: 101 mEq/L (ref 96–112)
Creatinine, Ser: 0.87 mg/dL (ref 0.40–1.20)
GFR: 71.22 mL/min (ref >60.00)
Glucose, Bld: 87 mg/dL (ref 70–99)
Potassium: 4.2 mEq/L (ref 3.5–5.1)
Sodium: 137 mEq/L (ref 135–145)
Total Bilirubin: 0.8 mg/dL (ref 0.2–1.2)
Total Protein: 7.5 g/dL (ref 6.0–8.3)

## 2020-09-04 LAB — TSH: TSH: 1.11 u[IU]/mL (ref 0.35–5.50)

## 2020-09-04 LAB — VITAMIN D 25 HYDROXY (VIT D DEFICIENCY, FRACTURES): VITD: 30.69 ng/mL (ref 30.00–100.00)

## 2020-09-04 LAB — LIPID PANEL
Cholesterol: 259 mg/dL — ABNORMAL HIGH (ref 0–200)
HDL: 111.9 mg/dL (ref >39.00)
LDL Cholesterol: 135 mg/dL — ABNORMAL HIGH (ref 0–99)
NonHDL: 147.05
Total CHOL/HDL Ratio: 2
Triglycerides: 59 mg/dL (ref 0.0–149.0)
VLDL: 11.8 mg/dL (ref 0.0–40.0)

## 2020-09-04 LAB — HEMOGLOBIN A1C: Hgb A1c MFr Bld: 5.7 % (ref 4.6–6.5)

## 2020-09-04 MED ORDER — ESCITALOPRAM OXALATE 10 MG PO TABS: 10.0000 mg | ORAL_TABLET | Freq: Every day | ORAL | 1 refills | Status: DC

## 2020-09-04 NOTE — Assessment & Plan Note (Addendum)
Anxiety uncontrolled. Start lexapro. EKG performed to evaluate for QT prolongation. EKG shows Sinus Rhythm. 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 No acute ischemia. No prior EKG to compare too. QT 436.  Close follow up.

## 2020-09-04 NOTE — Assessment & Plan Note (Signed)
Chronic, stable. Continue prn use albuterol.

## 2020-09-04 NOTE — Patient Instructions (Signed)
Start lexapro  Please call  and schedule your 3D mammogram, bone density scan as discussed.   Kristen Sandoval Dante, [place]  Referral for colonoscopy  Nice to see you!

## 2020-09-04 NOTE — Progress Notes (Signed)
Subjective:    Patient ID: Kristen Sandoval, female    DOB: [birth year], 65 y.o.   MRN: [id number]  CC: Kristen Sandoval is a 66 y.o. female who presents today for follow up.   HPI: Increased anxiety past several months related to work related stress.   Had a stressful job for the last year until returned to [employer] as of 2 weeks ago where she had worked previously.  Returning to prior position has been stressful.   She stopped zoloft on her own as felt nauseated on medication.   Trouble staying asleep and feels 'jittery.' No si /hi.   She is satisfied with her current weight and weight loss over the past year.   She has tried zoloft , lexapro in the past.   HTN- compliant with amlodipine 2.5mg  only as needed. Rarely uses. No cp. Sob.   H/o asthma- She is no longer on advair. She uses albulterol inhaler prn.       HISTORY:  Past Medical History:  Diagnosis Date   Allergy    Anxiety    Arthritis    Asthma    GERD (gastroesophageal reflux disease)    Past Surgical History:  Procedure Laterality Date   ABDOMINAL HYSTERECTOMY  1990's    for noncancerous reasons, ovarian cyst and menorrhagia; NO cervix on exam   KNEE SURGERY Left 09/2009   OOPHORECTOMY Bilateral 21's   TONSILLECTOMY AND ADENOIDECTOMY     Family History  Adopted: Yes  Problem Relation Age of Onset   Lung cancer Mother     Allergies: Clarithromycin, Erythromycin, and Latex Current Outpatient Medications on File Prior to Visit  Medication Sig Dispense Refill   diclofenac sodium (VOLTAREN) 1 % GEL Apply 4 g topically 4 (four) times daily. 50 g 1   fluticasone (FLONASE) 50 MCG/ACT nasal spray TAKE 1 SPRAY INTO BOTH NOSTRILS EVERY DAY 16 g 1   VENTOLIN HFA 108 (90 Base) MCG/ACT inhaler INHALE 2 PUFFS INTO LUNGS EVERY 6 HOURS AS NEEDED FOR WHEEZING OR SHORTNESS OF BREATH 18 g 3   amLODipine (NORVASC) 2.5 MG tablet Take on tablet as needed if BP is over 130/80. (Patient not taking: Reported on  09/04/2020) 30 tablet 1   No current facility-administered medications on file prior to visit.    Social History   Tobacco Use   Smoking status: Former    Packs/day: 0.25    Years: 31.00    Pack years: 7.75    Types: Cigarettes    Quit date: 02/27/2006    Years since quitting: 14.5   Smokeless tobacco: Never   Tobacco comments:    2006  Substance Use Topics   Alcohol use: Yes    Alcohol/week: 0.0 standard drinks    Comment: Moderate use   Drug use: No    Review of Systems  Constitutional:  Negative for chills and fever.  Respiratory:  Negative for cough.   Cardiovascular:  Negative for chest pain and palpitations.  Gastrointestinal:  Negative for nausea and vomiting.  Psychiatric/Behavioral:  Positive for sleep disturbance. Negative for suicidal ideas. The patient is nervous/anxious.      Objective:    BP 122/72 (BP Location: Left Arm, Patient Position: Sitting, Cuff Size: Normal)   Pulse 85   Ht 5\' 1"  (1.549 m)   Wt 108 lb 12.8 oz (49.4 kg)   SpO2 98%   BMI 20.56 kg/m  BP Readings from Last 3 Encounters:  09/04/20 122/72  07/26/19 (!) 150/90  12/11/17 132/80   Wt Readings from Last 3 Encounters:  09/04/20 108 lb 12.8 oz (49.4 kg)  07/26/19 120 lb 9.6 oz (54.7 kg)  02/27/19 126 lb (57.2 kg)    Physical Exam Vitals reviewed.  Constitutional:      Appearance: She is well-developed.  Eyes:     Conjunctiva/sclera: Conjunctivae normal.  Neck:     Thyroid: No thyroid mass or thyromegaly.  Cardiovascular:     Rate and Rhythm: Normal rate and regular rhythm.     Pulses: Normal pulses.     Heart sounds: Normal heart sounds.  Pulmonary:     Effort: Pulmonary effort is normal.     Breath sounds: Normal breath sounds. No wheezing, rhonchi or rales.  Lymphadenopathy:     Head:     Right side of head: No submental, submandibular, tonsillar, preauricular, posterior auricular or occipital adenopathy.     Left side of head: No submental, submandibular, tonsillar,  preauricular, posterior auricular or occipital adenopathy.     Cervical: No cervical adenopathy.  Skin:    General: Skin is warm and dry.  Neurological:     Mental Status: She is alert.  Psychiatric:        Speech: Speech normal.        Behavior: Behavior normal.        Thought Content: Thought content normal.       Assessment & Plan:   Problem List Items Addressed This Visit       Respiratory   Airway hyperreactivity    Chronic, stable. Continue prn use albuterol.          Other   Anxiety and depression - Primary    Anxiety uncontrolled. Start lexapro. EKG performed to evaluate for QT prolongation. EKG shows Sinus Rhythm. 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 No acute ischemia. No prior EKG to compare too. QT 436.  Close follow up.        Relevant Medications   escitalopram (LEXAPRO) 10 MG tablet   Other Relevant Orders   TSH   CBC with Differential/Platelet   Comprehensive metabolic panel   Hemoglobin A1c   Lipid panel   VITAMIN D 25 Hydroxy (Vit-D Deficiency, Fractures)   Ambulatory referral to Gastroenterology   MM 3D SCREEN BREAST BILATERAL   EKG 12-Lead (Completed)   Elevated blood pressure reading    Excellent control. Continue amlodipine 2.5mg  prn.        Screening for breast cancer   Other Visit Diagnoses     Screen for colon cancer       Relevant Orders   Ambulatory referral to Gastroenterology        I have discontinued Artemisia B. Sao's Advair Diskus, meloxicam, sertraline, cetirizine, and azithromycin. I am also having her start on escitalopram. Additionally, I am having her maintain her diclofenac sodium, Ventolin HFA, fluticasone, and amLODipine.   Meds ordered this encounter  Medications   escitalopram (LEXAPRO) 10 MG tablet    Sig: Take 1 tablet (10 mg total) by mouth daily.    Dispense:  90 tablet    Refill:  1    Order Specific Question:   Supervising Provider    Answer:   Crecencio Mc [[id number]]     Return precautions given.    Risks, benefits, and alternatives of the medications and treatment plan prescribed today were discussed, and patient expressed understanding.   Education regarding symptom management and diagnosis given to patient on AVS.  Continue to follow with Mable Paris  G, FNP for routine health maintenance.   Bubba Camp and I agreed with plan.   Mable Paris, FNP

## 2020-09-04 NOTE — Assessment & Plan Note (Signed)
Excellent control. Continue amlodipine 2.5mg  prn.

## 2020-09-17 ENCOUNTER — Other Ambulatory Visit: Payer: Self-pay

## 2020-09-17 ENCOUNTER — Encounter: Payer: Self-pay | Admitting: Gastroenterology

## 2020-09-17 ENCOUNTER — Ambulatory Visit
Admission: RE | Admit: 2020-09-17 | Discharge: 2020-09-17 | Disposition: A | Discharge status: Home / Self Care | Payer: BC Managed Care – PPO | Source: Ambulatory Visit | Attending: Family | Admitting: Family

## 2020-09-17 DIAGNOSIS — F32A Depression, unspecified: Secondary | ICD-10-CM

## 2020-09-17 DIAGNOSIS — F419 Anxiety disorder, unspecified: Secondary | ICD-10-CM | POA: Diagnosis present

## 2020-09-17 DIAGNOSIS — Z1231 Encounter for screening mammogram for malignant neoplasm of breast: Secondary | ICD-10-CM | POA: Insufficient documentation

## 2020-09-28 HISTORY — PX: COLONOSCOPY W/ POLYPECTOMY: SHX1380

## 2020-09-29 ENCOUNTER — Ambulatory Visit (AMBULATORY_SURGERY_CENTER): Payer: BC Managed Care – PPO | Admitting: *Deleted

## 2020-09-29 ENCOUNTER — Other Ambulatory Visit: Payer: Self-pay

## 2020-09-29 VITALS — Ht 61.0 in | Wt 108.0 lb

## 2020-09-29 DIAGNOSIS — Z1211 Encounter for screening for malignant neoplasm of colon: Secondary | ICD-10-CM

## 2020-09-29 MED ORDER — NA SULFATE-K SULFATE-MG SULF 17.5-3.13-1.6 GM/177ML PO SOLN: 1.0000 | ORAL | 0 refills | Status: DC

## 2020-09-29 NOTE — Progress Notes (Signed)
Patient's pre-visit was done today over the phone with the patient due to COVID-19 pandemic. Name,DOB and address verified. Insurance verified. Patient denies any allergies to Eggs and Soy. Patient denies any problems with anesthesia/sedation. Patient denies taking diet pills or blood thinners. No home Oxygen. Packet of Prep instructions mailed to patient including a copy of a consent form-pt is aware. Patient understands to call us back with any questions or concerns. Patient is aware of our care-partner policy and 0000000 safety protocol.   EMMI education assigned to the patient for the procedure, sent to [place].   The patient is COVID-19 vaccinated.

## 2020-10-13 ENCOUNTER — Encounter: Payer: Self-pay | Admitting: Gastroenterology

## 2020-10-13 ENCOUNTER — Ambulatory Visit (AMBULATORY_SURGERY_CENTER): Payer: BC Managed Care – PPO | Admitting: Gastroenterology

## 2020-10-13 VITALS — BP 151/85 | HR 59 | Temp 97.3°F | Resp 13 | Ht 61.0 in | Wt 108.0 lb

## 2020-10-13 DIAGNOSIS — Z1211 Encounter for screening for malignant neoplasm of colon: Secondary | ICD-10-CM

## 2020-10-13 DIAGNOSIS — D127 Benign neoplasm of rectosigmoid junction: Secondary | ICD-10-CM | POA: Diagnosis not present

## 2020-10-13 MED ORDER — SODIUM CHLORIDE 0.9 % IV SOLN: 500.0000 mL | Freq: Once | INTRAVENOUS | Status: DC

## 2020-10-13 NOTE — Progress Notes (Signed)
pt tolerated well. VSS. awake and to recovery. Report given to RN.  

## 2020-10-13 NOTE — Op Note (Signed)
[facility] Patient Name: Kristen Sandoval Procedure Date: 10/13/2020 8:00 AM MRN: [id number] Endoscopist: Justice Britain , MD Age: 65 Referring MD:  Date of Birth: 24-Oct-1955 Gender: Female Account #: [id number] Procedure:                Colonoscopy Indications:              Screening for colorectal malignant neoplasm Medicines:                Monitored Anesthesia Care Procedure:                Pre-Anesthesia Assessment:                           - Prior to the procedure, a History and Physical                            was performed, and patient medications and                            allergies were reviewed. The patient's tolerance of                            previous anesthesia was also reviewed. The risks                            and benefits of the procedure and the sedation                            options and risks were discussed with the patient.                            All questions were answered, and informed consent                            was obtained. Prior Anticoagulants: The patient has                            taken no previous anticoagulant or antiplatelet                            agents. ASA Grade Assessment: II - A patient with                            mild systemic disease. After reviewing the risks                            and benefits, the patient was deemed in                            satisfactory condition to undergo the procedure.                           After obtaining informed consent, the colonoscope  was passed under direct vision. Throughout the                            procedure, the patient's blood pressure, pulse, and                            oxygen saturations were monitored continuously. The                            0441 PCF-H190TL Slim SB Colonoscope was introduced                            through the anus and advanced to the 5 cm into the                            ileum. The  colonoscopy was performed without                            difficulty. The patient tolerated the procedure.                            The quality of the bowel preparation was good. The                            terminal ileum, ileocecal valve, appendiceal                            orifice, and rectum were photographed. Scope In: 8:13:28 AM Scope Out: 8:30:25 AM Scope Withdrawal Time: 0 hours 14 minutes 45 seconds  Total Procedure Duration: 0 hours 16 minutes 57 seconds  Findings:                 Skin tags were found on perianal exam.                           The digital rectal exam findings include                            hemorrhoids. Pertinent negatives include no                            palpable rectal lesions.                           The terminal ileum and ileocecal valve appeared                            normal.                           A 10 mm polyp was found in the recto-sigmoid colon.                            The polyp was semi-sessile. The polyp was removed  with a cold snare. Resection and retrieval were                            complete. After more than 5 minutes of monitoring                            the area there was a persistent ooze. to prevent                            further bleeding after the polypectomy, one                            hemostatic clip was successfully placed (MR                            conditional). There was no bleeding at the end of                            the procedure.                           Multiple small and large-mouthed diverticula were                            found in the recto-sigmoid colon, sigmoid colon,                            descending colon and transverse colon.                           Normal mucosa was found in the entire colon                            otherwise.                           Anal papilla was hypertrophied.                           Non-bleeding  non-thrombosed internal and external                            hemorrhoids were found during retroflexion, during                            perianal exam and during digital exam. The                            hemorrhoids were Grade II (internal hemorrhoids                            that prolapse but reduce spontaneously). Complications:            No immediate complications. Estimated Blood Loss:     Estimated blood loss was minimal. Impression:               -  Perianal skin tags found on perianal exam.                            Hemorrhoids found on digital rectal exam.                           - The examined portion of the ileum was normal.                           - One 10 mm polyp at the recto-sigmoid colon,                            removed with a cold snare. Resected and retrieved.                            Due to persistent oozing after 5 minutes, a clip                            (MR conditional) was placed.                           - Diverticulosis in the recto-sigmoid colon, in the                            sigmoid colon, in the descending colon and in the                            transverse colon.                           - Normal mucosa in the entire examined colon                            otherwise.                           - Anal papilla was hypertrophied.                           - Non-bleeding non-thrombosed internal and                            hemorrhoids. Recommendation:           - The patient will be observed post-procedure,                            until all discharge criteria are met.                           - Discharge patient to home.                           - Patient has a contact number available for  emergencies. The signs and symptoms of potential                            delayed complications were discussed with the                            patient. Return to normal activities tomorrow.                             Written discharge instructions were provided to the                            patient.                           - High fiber diet.                           - Use FiberCon 1-2 tablets PO daily.                           - Continue present medications.                           - Await pathology results.                           - Repeat colonoscopy for surveillance based on                            pathology results.                           - The findings and recommendations were discussed                            with the patient.                           - The findings and recommendations were discussed                            with the patient's family. Justice Britain, MD 10/13/2020 8:38:02 AM

## 2020-10-13 NOTE — Progress Notes (Signed)
Called to room to assist during endoscopic procedure.  Patient ID and intended procedure confirmed with present staff. Received instructions for my participation in the procedure from the performing physician.  

## 2020-10-13 NOTE — Patient Instructions (Signed)
Handout given: diverticulosis, hemorrhoids, polyps Start high fiber diet Use fibercon 1-2 tablets by mouth daily Continue current medications Await pathology results  YOU HAD AN ENDOSCOPIC PROCEDURE TODAY AT [facility]:   Refer to the procedure report that was given to you for any specific questions about what was found during the examination.  If the procedure report does not answer your questions, please call your gastroenterologist to clarify.  If you requested that your care partner not be given the details of your procedure findings, then the procedure report has been included in a sealed envelope for you to review at your convenience later.  YOU SHOULD EXPECT: Some feelings of bloating in the abdomen. Passage of more gas than usual.  Walking can help get rid of the air that was put into your GI tract during the procedure and reduce the bloating. If you had a lower endoscopy (such as a colonoscopy or flexible sigmoidoscopy) you may notice spotting of blood in your stool or on the toilet paper. If you underwent a bowel prep for your procedure, you may not have a normal bowel movement for a few days.  Please Note:  You might notice some irritation and congestion in your nose or some drainage.  This is from the oxygen used during your procedure.  There is no need for concern and it should clear up in a day or so.  SYMPTOMS TO REPORT IMMEDIATELY:  Following lower endoscopy (colonoscopy or flexible sigmoidoscopy):  Excessive amounts of blood in the stool  Significant tenderness or worsening of abdominal pains  Swelling of the abdomen that is new, acute  Fever of 100F or higher  For urgent or emergent issues, a gastroenterologist can be reached at any hour by calling [phone]. Do not use MyChart messaging for urgent concerns.   DIET:  We do recommend a small meal at first, but then you may proceed to your regular diet.  Drink plenty of fluids but you should avoid  alcoholic beverages for 24 hours.  ACTIVITY:  You should plan to take it easy for the rest of today and you should NOT DRIVE or use heavy machinery until tomorrow (because of the sedation medicines used during the test).    FOLLOW UP: Our staff will call the number listed on your records 48-72 hours following your procedure to check on you and address any questions or concerns that you may have regarding the information given to you following your procedure. If we do not reach you, we will leave a message.  We will attempt to reach you two times.  During this call, we will ask if you have developed any symptoms of COVID 19. If you develop any symptoms (ie: fever, flu-like symptoms, shortness of breath, cough etc.) before then, please call [phone].  If you test positive for Covid 19 in the 2 weeks post procedure, please call and report this information to [place].    If any biopsies were taken you will be contacted by phone or by letter within the next 1-3 weeks.  Please call us at [phone] if you have not heard about the biopsies in 3 weeks.   SIGNATURES/CONFIDENTIALITY: You and/or your care partner have signed paperwork which will be entered into your electronic medical record.  These signatures attest to the fact that that the information above on your After Visit Summary has been reviewed and is understood.  Full responsibility of the confidentiality of this discharge information lies with you and/or your  care-partner.  

## 2020-10-13 NOTE — Progress Notes (Signed)
GASTROENTEROLOGY PROCEDURE H&P NOTE   Primary Care Physician: Burnard Hawthorne, FNP  HPI: Kristen Sandoval is a 65 y.o. female who presents for Colonoscopy for screening.  Past Medical History:  Diagnosis Date   Allergy    Anxiety    Arthritis    Asthma    GERD (gastroesophageal reflux disease)    Past Surgical History:  Procedure Laterality Date   ABDOMINAL HYSTERECTOMY  1990's    for noncancerous reasons, ovarian cyst and menorrhagia; NO cervix on exam   CARPAL TUNNEL RELEASE     COLONOSCOPY  2012   in [place], [place]-Normal exam per pt   KNEE SURGERY Left 09/2009   OOPHORECTOMY Bilateral 1990's   TONSILLECTOMY AND ADENOIDECTOMY     UPPER GASTROINTESTINAL ENDOSCOPY  2012   in [place]   Current Outpatient Medications  Medication Sig Dispense Refill   escitalopram (LEXAPRO) 10 MG tablet Take 1 tablet (10 mg total) by mouth daily. 90 tablet 1   Multiple Vitamin (MULTIVITAMIN) tablet Take 1 tablet by mouth daily.     VENTOLIN HFA 108 (90 Base) MCG/ACT inhaler INHALE 2 PUFFS INTO LUNGS EVERY 6 HOURS AS NEEDED FOR WHEEZING OR SHORTNESS OF BREATH 18 g 3   fluticasone (FLONASE) 50 MCG/ACT nasal spray TAKE 1 SPRAY INTO BOTH NOSTRILS EVERY DAY 16 g 1   Current Facility-Administered Medications  Medication Dose Route Frequency Provider Last Rate Last Admin   0.9 %  sodium chloride infusion  500 mL Intravenous Once Mansouraty, Telford Nab., MD        Current Outpatient Medications:    escitalopram (LEXAPRO) 10 MG tablet, Take 1 tablet (10 mg total) by mouth daily., Disp: 90 tablet, Rfl: 1   Multiple Vitamin (MULTIVITAMIN) tablet, Take 1 tablet by mouth daily., Disp: , Rfl:    VENTOLIN HFA 108 (90 Base) MCG/ACT inhaler, INHALE 2 PUFFS INTO LUNGS EVERY 6 HOURS AS NEEDED FOR WHEEZING OR SHORTNESS OF BREATH, Disp: 18 g, Rfl: 3   fluticasone (FLONASE) 50 MCG/ACT nasal spray, TAKE 1 SPRAY INTO BOTH NOSTRILS EVERY DAY, Disp: 16 g, Rfl: 1  Current Facility-Administered Medications:    0.9 %   sodium chloride infusion, 500 mL, Intravenous, Once, Mansouraty, Telford Nab., MD Allergies  Allergen Reactions   Biaxin [Clarithromycin] Itching and Nausea And Vomiting   Erythromycin     vomiting   Latex    Family History  Adopted: Yes  Problem Relation Age of Onset   Lung cancer Mother    Breast cancer Neg Hx    Social History   Socioeconomic History   Marital status: Married    Spouse name: Not on file   Number of children: Not on file   Years of education: Not on file   Highest education level: Not on file  Occupational History   Not on file  Tobacco Use   Smoking status: Former    Packs/day: 0.25    Years: 31.00    Pack years: 7.75    Types: Cigarettes    Quit date: 02/27/2006    Years since quitting: 14.6   Smokeless tobacco: Never   Tobacco comments:    2006  Substance and Sexual Activity   Alcohol use: Yes    Alcohol/week: 8.0 standard drinks    Types: 8 Standard drinks or equivalent per week    Comment: Moderate use   Drug use: No   Sexual activity: Not on file  Other Topics Concern   Not on file  Social History Narrative  2 children- son and daughter      Working- Glass blower/designer for [employer]         Social Determinants of Radio broadcast assistant Strain: Not on [employer] Insecurity: Not on file  Transportation Needs: Not on file  Physical Activity: Not on file  Stress: Not on file  Social Connections: Not on file  Intimate Partner Violence: Not on file    Physical Exam: Vital signs in last 24 hours: '@VSRANGES'$ @   GEN: NAD EYE: Sclerae anicteric ENT: MMM CV: Non-tachycardic GI: Soft, NT/ND NEURO:  Alert & Oriented x 3  Lab Results: No results for input(s): WBC, HGB, HCT, PLT in the last 72 hours. BMET No results for input(s): NA, K, CL, CO2, GLUCOSE, BUN, CREATININE, CALCIUM in the last 72 hours. LFT No results for input(s): PROT, ALBUMIN, AST, ALT, ALKPHOS, BILITOT, BILIDIR, IBILI in the last 72 hours. PT/INR No  results for input(s): LABPROT, INR in the last 72 hours.   Impression / Plan: This is a 65 y.o.female who presents for Colonoscopy for screening.  The risks and benefits of endoscopic evaluation/treatment were discussed with the patient and/or family; these include but are not limited to the risk of perforation, infection, bleeding, missed lesions, lack of diagnosis, severe illness requiring hospitalization, as well as anesthesia and sedation related illnesses.  The patient's history has been reviewed, patient examined, no change in status, and deemed stable for procedure.  The patient and/or family is agreeable to proceed.    Justice Britain, MD [facility] Office # [phone]

## 2020-10-13 NOTE — Progress Notes (Signed)
VS by CW  Pt's states no medical or surgical changes since previsit or office visit.  

## 2020-10-15 ENCOUNTER — Telehealth: Payer: Self-pay | Admitting: *Deleted

## 2020-10-15 NOTE — Telephone Encounter (Signed)
  Follow up Call-  Call back number 10/13/2020  Post procedure Call Back phone  # [phone]  Permission to leave phone message Yes  Some recent data might be hidden     Patient questions:  Do you have a fever, pain , or abdominal swelling? No. Pain Score  0 *  Have you tolerated food without any problems? Yes.    Have you been able to return to your normal activities? Yes.    Do you have any questions about your discharge instructions: Diet   No. Medications  No. Follow up visit  No.  Do you have questions or concerns about your Care? No.  Actions: * If pain score is 4 or above: No action needed, pain <4.  Have you developed a fever since your procedure? no  2.   Have you had an respiratory symptoms (SOB or cough) since your procedure? no  3.   Have you tested positive for COVID 19 since your procedure no  4.   Have you had any family members/close contacts diagnosed with the COVID 19 since your procedure?  no   If yes to any of these questions please route to Joylene John, RN and Joella Prince, RN

## 2020-10-16 ENCOUNTER — Encounter: Payer: Self-pay | Admitting: Gastroenterology

## 2020-10-19 ENCOUNTER — Encounter: Payer: BC Managed Care – PPO | Admitting: Family

## 2020-10-25 ENCOUNTER — Encounter: Payer: Self-pay | Admitting: Family

## 2020-10-30 ENCOUNTER — Other Ambulatory Visit: Payer: Self-pay

## 2020-10-30 ENCOUNTER — Ambulatory Visit (INDEPENDENT_AMBULATORY_CARE_PROVIDER_SITE_OTHER): Payer: BC Managed Care – PPO | Admitting: Family

## 2020-10-30 ENCOUNTER — Encounter: Payer: Self-pay | Admitting: Family

## 2020-10-30 VITALS — BP 112/68 | HR 88 | Temp 98.4°F | Ht 61.0 in | Wt 116.0 lb

## 2020-10-30 DIAGNOSIS — E7800 Pure hypercholesterolemia, unspecified: Secondary | ICD-10-CM

## 2020-10-30 DIAGNOSIS — F419 Anxiety disorder, unspecified: Secondary | ICD-10-CM

## 2020-10-30 DIAGNOSIS — Z23 Encounter for immunization: Secondary | ICD-10-CM

## 2020-10-30 DIAGNOSIS — Z0000 Encounter for general adult medical examination without abnormal findings: Secondary | ICD-10-CM

## 2020-10-30 DIAGNOSIS — F32A Depression, unspecified: Secondary | ICD-10-CM

## 2020-10-30 NOTE — Assessment & Plan Note (Addendum)
Clinical breast exam performed today.  Deferred pelvic exam in the absence of complaints and patient is no longer screening for cervical cancer she has had a hysterectomy.  Encouraged continued exercise

## 2020-10-30 NOTE — Progress Notes (Signed)
Subjective:    Patient ID: Kristen Sandoval, female    DOB: 1955-03-13, 65 y.o.   MRN: [id number]  CC: Kristen Sandoval is a 65 y.o. female who presents today for physical exam.    HPI: Anxiety and depression-improved. started Lexapro 10 mg 2 months ago. She feels much better and that dose is adequate. Stress at work is much improved.     Colorectal Cancer Screening: UTD , 10/13/2020, repeat in 3 years Breast Cancer Screening: Mammogram UTD Cervical Cancer Screening:  history of hysterectomy for noncancerous reasons. She has no cervix.  Bone Health screening/DEXA for 65+: No increased fracture risk. Defer screening at this time       Tetanus - UTD        Pneumococcal - Complete  Labs: Screening labs today. Exercise: Gets regular exercise, boot camp, walking.    Alcohol use:  moderate Smoking/tobacco use: former smoker, quit 2006     HISTORY:  Past Medical History:  Diagnosis Date   Allergy    Anxiety    Arthritis    Asthma    GERD (gastroesophageal reflux disease)     Past Surgical History:  Procedure Laterality Date   ABDOMINAL HYSTERECTOMY  1990's    for noncancerous reasons, ovarian cyst and menorrhagia; NO cervix on exam   CARPAL TUNNEL RELEASE     COLONOSCOPY  2012   in [place], [place]-Normal exam per pt   KNEE SURGERY Left 09/2009   OOPHORECTOMY Bilateral 40's   TONSILLECTOMY AND ADENOIDECTOMY     UPPER GASTROINTESTINAL ENDOSCOPY  2012   in [place]   Family History  Adopted: Yes  Problem Relation Age of Onset   Lung cancer Mother    Breast cancer Neg Hx       ALLERGIES: Biaxin [clarithromycin], Erythromycin, and Latex  Current Outpatient Medications on File Prior to Visit  Medication Sig Dispense Refill   escitalopram (LEXAPRO) 10 MG tablet Take 1 tablet (10 mg total) by mouth daily. 90 tablet 1   fluticasone (FLONASE) 50 MCG/ACT nasal spray TAKE 1 SPRAY INTO BOTH NOSTRILS EVERY DAY 16 g 1   Multiple Vitamin (MULTIVITAMIN) tablet Take 1 tablet by mouth daily.      VENTOLIN HFA 108 (90 Base) MCG/ACT inhaler INHALE 2 PUFFS INTO LUNGS EVERY 6 HOURS AS NEEDED FOR WHEEZING OR SHORTNESS OF BREATH 18 g 3   No current facility-administered medications on file prior to visit.    Social History   Tobacco Use   Smoking status: Former    Packs/day: 0.25    Years: 31.00    Pack years: 7.75    Types: Cigarettes    Quit date: 02/27/2006    Years since quitting: 14.6   Smokeless tobacco: Never   Tobacco comments:    2006  Substance Use Topics   Alcohol use: Yes    Alcohol/week: 8.0 standard drinks    Types: 8 Standard drinks or equivalent per week    Comment: Moderate use   Drug use: No    Review of Systems  Constitutional:  Negative for chills, fever and unexpected weight change.  HENT:  Negative for congestion.   Respiratory:  Negative for cough.   Cardiovascular:  Negative for chest pain, palpitations and leg swelling.  Gastrointestinal:  Negative for abdominal pain, nausea and vomiting.  Genitourinary:  Negative for pelvic pain.  Musculoskeletal:  Negative for arthralgias and myalgias.  Skin:  Negative for rash.  Neurological:  Negative for headaches.  Hematological:  Negative for adenopathy.  Psychiatric/Behavioral:  Negative for confusion.      Objective:    BP 112/68 (BP Location: Left Arm, Patient Position: Sitting, Cuff Size: Normal)   Pulse 88   Temp 98.4 F (36.9 C) (Oral)   Ht '5\' 1"'$  (1.549 m)   Wt 116 lb (52.6 kg)   SpO2 97%   BMI 21.92 kg/m   BP Readings from Last 3 Encounters:  10/30/20 112/68  10/13/20 (!) 151/85  09/04/20 122/72   Wt Readings from Last 3 Encounters:  10/30/20 116 lb (52.6 kg)  10/13/20 108 lb (49 kg)  09/29/20 108 lb (49 kg)    Physical Exam Vitals reviewed.  Constitutional:      Appearance: Normal appearance. She is well-developed.  Eyes:     Conjunctiva/sclera: Conjunctivae normal.  Neck:     Thyroid: No thyroid mass or thyromegaly.  Cardiovascular:     Rate and Rhythm: Normal rate  and regular rhythm.     Pulses: Normal pulses.     Heart sounds: Normal heart sounds.  Pulmonary:     Effort: Pulmonary effort is normal.     Breath sounds: Normal breath sounds. No wheezing, rhonchi or rales.  Chest:  Breasts:    Breasts are symmetrical.     Right: No inverted nipple, mass, nipple discharge, skin change or tenderness.     Left: No inverted nipple, mass, nipple discharge, skin change or tenderness.  Abdominal:     General: Bowel sounds are normal. There is no distension.     Palpations: Abdomen is soft. Abdomen is not rigid. There is no fluid wave or mass.     Tenderness: There is no abdominal tenderness. There is no guarding or rebound.  Lymphadenopathy:     Head:     Right side of head: No submental, submandibular, tonsillar, preauricular, posterior auricular or occipital adenopathy.     Left side of head: No submental, submandibular, tonsillar, preauricular, posterior auricular or occipital adenopathy.     Cervical: No cervical adenopathy.     Right cervical: No superficial, deep or posterior cervical adenopathy.    Left cervical: No superficial, deep or posterior cervical adenopathy.  Skin:    General: Skin is warm and dry.  Neurological:     Mental Status: She is alert.  Psychiatric:        Speech: Speech normal.        Behavior: Behavior normal.        Thought Content: Thought content normal.       Assessment & Plan:   Problem List Items Addressed This Visit       Other   Anxiety and depression    Improved. Continue lexapro '10mg'$       Hypercholesterolemia without hypertriglyceridemia    Discussed LDL > 100. No family h/o CVD. She declines statin therapy or referral to cardiology for further restratification including CT calcium score.  She would like to continue to monitor      Routine physical examination    Clinical breast exam performed today.  Deferred pelvic exam in the absence of complaints and patient is no longer screening for cervical  cancer she has had a hysterectomy.  Encouraged continued exercise      Other Visit Diagnoses     Need for immunization against influenza    -  Primary   Relevant Orders   Flu Vaccine QUAD 57moIM (Fluarix, Fluzone & Alfiuria Quad PF) (Completed)        I am having Kristen Sandoval maintain  her Ventolin HFA, fluticasone, escitalopram, and multivitamin.   No orders of the defined types were placed in this encounter.   Return precautions given.   Risks, benefits, and alternatives of the medications and treatment plan prescribed today were discussed, and patient expressed understanding.   Education regarding symptom management and diagnosis given to patient on AVS.   Continue to follow with Burnard Hawthorne, FNP for routine health maintenance.   Bubba Camp and I agreed with plan.   Mable Paris, FNP

## 2020-10-30 NOTE — Patient Instructions (Signed)
Nice to see you!  Health Maintenance for Postmenopausal Women Menopause is a normal process in which your ability to get pregnant comes to an end. This process happens slowly over many months or years, usually between the ages of 63 and 21. Menopause is complete when you have missed your menstrual periods for 12 months. It is important to talk with your health care provider about some of the most common conditions that affect women after menopause (postmenopausal women). These include heart disease, cancer, and bone loss (osteoporosis). Adopting a healthy lifestyle and getting preventive care can help to promote your health and wellness. The actions you take can also lower your chances of developing some of these common conditions. What should I know about menopause? During menopause, you may get a number of symptoms, such as: Hot flashes. These can be moderate or severe. Night sweats. Decrease in sex drive. Mood swings. Headaches. Tiredness. Irritability. Memory problems. Insomnia. Choosing to treat or not to treat these symptoms is a decision that you make with your health care provider. Do I need hormone replacement therapy? Hormone replacement therapy is effective in treating symptoms that are caused by menopause, such as hot flashes and night sweats. Hormone replacement carries certain risks, especially as you become older. If you are thinking about using estrogen or estrogen with progestin, discuss the benefits and risks with your health care provider. What is my risk for heart disease and stroke? The risk of heart disease, heart attack, and stroke increases as you age. One of the causes may be a change in the body's hormones during menopause. This can affect how your body uses dietary fats, triglycerides, and cholesterol. Heart attack and stroke are medical emergencies. There are many things that you can do to help prevent heart disease and stroke. Watch your blood pressure High blood  pressure causes heart disease and increases the risk of stroke. This is more likely to develop in people who have high blood pressure readings, are of African descent, or are overweight. Have your blood pressure checked: Every 3-5 years if you are 63-87 years of age. Every year if you are 63 years old or older. Eat a healthy diet  Eat a diet that includes plenty of vegetables, fruits, low-fat dairy products, and lean protein. Do not eat a lot of foods that are high in solid fats, added sugars, or sodium. Get regular exercise Get regular exercise. This is one of the most important things you can do for your health. Most adults should: Try to exercise for at least 150 minutes each week. The exercise should increase your heart rate and make you sweat (moderate-intensity exercise). Try to do strengthening exercises at least twice each week. Do these in addition to the moderate-intensity exercise. Spend less time sitting. Even light physical activity can be beneficial. Other tips Work with your health care provider to achieve or maintain a healthy weight. Do not use any products that contain nicotine or tobacco, such as cigarettes, e-cigarettes, and chewing tobacco. If you need help quitting, ask your health care provider. Know your numbers. Ask your health care provider to check your cholesterol and your blood sugar (glucose). Continue to have your blood tested as directed by your health care provider. Do I need screening for cancer? Depending on your health history and family history, you may need to have cancer screening at different stages of your life. This may include screening for: Breast cancer. Cervical cancer. Lung cancer. Colorectal cancer. What is my risk for osteoporosis?  After menopause, you may be at increased risk for osteoporosis. Osteoporosis is a condition in which bone destruction happens more quickly than new bone creation. To help prevent osteoporosis or the bone fractures  that can happen because of osteoporosis, you may take the following actions: If you are 63-65 years old, get at least 1,000 mg of calcium and at least 600 mg of vitamin D per day. If you are older than age 78 but younger than age 63, get at least 1,200 mg of calcium and at least 600 mg of vitamin D per day. If you are older than age 63, get at least 1,200 mg of calcium and at least 800 mg of vitamin D per day. Smoking and drinking excessive alcohol increase the risk of osteoporosis. Eat foods that are rich in calcium and vitamin D, and do weight-bearing exercises several times each week as directed by your health care provider. How does menopause affect my mental health? Depression may occur at any age, but it is more common as you become older. Common symptoms of depression include: Low or sad mood. Changes in sleep patterns. Changes in appetite or eating patterns. Feeling an overall lack of motivation or enjoyment of activities that you previously enjoyed. Frequent crying spells. Talk with your health care provider if you think that you are experiencing depression. General instructions See your health care provider for regular wellness exams and vaccines. This may include: Scheduling regular health, dental, and eye exams. Getting and maintaining your vaccines. These include: Influenza vaccine. Get this vaccine each year before the flu season begins. Pneumonia vaccine. Shingles vaccine. Tetanus, diphtheria, and pertussis (Tdap) booster vaccine. Your health care provider may also recommend other immunizations. Tell your health care provider if you have ever been abused or do not feel safe at home. Summary Menopause is a normal process in which your ability to get pregnant comes to an end. This condition causes hot flashes, night sweats, decreased interest in sex, mood swings, headaches, or lack of sleep. Treatment for this condition may include hormone replacement therapy. Take actions to  keep yourself healthy, including exercising regularly, eating a healthy diet, watching your weight, and checking your blood pressure and blood sugar levels. Get screened for cancer and depression. Make sure that you are up to date with all your vaccines. This information is not intended to replace advice given to you by your health care provider. Make sure you discuss any questions you have with your health care provider. Document Revised: 02/07/2018 Document Reviewed: 02/07/2018 Elsevier Patient Education  2022 [employer].

## 2020-10-30 NOTE — Assessment & Plan Note (Signed)
Improved. Continue lexapro '10mg'$ 

## 2020-10-30 NOTE — Assessment & Plan Note (Signed)
Discussed LDL > 100. No family h/o CVD. She declines statin therapy or referral to cardiology for further restratification including CT calcium score.  She would like to continue to monitor

## 2020-11-16 ENCOUNTER — Ambulatory Visit
Admission: RE | Admit: 2020-11-16 | Discharge: 2020-11-16 | Disposition: A | Discharge status: Home / Self Care | Payer: BC Managed Care – PPO | Source: Ambulatory Visit | Attending: Emergency Medicine | Admitting: Emergency Medicine

## 2020-11-16 ENCOUNTER — Other Ambulatory Visit: Payer: Self-pay

## 2020-11-16 ENCOUNTER — Telehealth: Payer: Self-pay | Admitting: Family

## 2020-11-16 VITALS — BP 147/87 | HR 101 | Temp 99.4°F | Resp 18

## 2020-11-16 DIAGNOSIS — J0100 Acute maxillary sinusitis, unspecified: Secondary | ICD-10-CM

## 2020-11-16 MED ORDER — AMOXICILLIN 875 MG PO TABS: 875.0000 mg | ORAL_TABLET | Freq: Two times a day (BID) | ORAL | 0 refills | Status: AC

## 2020-11-16 NOTE — Discharge Instructions (Addendum)
Take amoxicillin as directed.    Follow up with your primary care provider if your symptoms are not improving.

## 2020-11-16 NOTE — ED Triage Notes (Signed)
Believes she has a sinus infection. Congestion, facial pain, headache for 1 week. Has done multiple negative covid tests.

## 2020-11-16 NOTE — Telephone Encounter (Signed)
Patient informed, Due to the high volume of calls and your symptoms we have to forward your call to our Triage Nurse to expedient your call. Please hold for the transfer.  Patient transferred to [place] at Access Nurse. Due to thinking she may have a sinus infection with coughing up green mucous,nasal congestion,teeth pain and a headache.No openings in office or virtual.

## 2020-11-16 NOTE — ED Provider Notes (Signed)
[facility]    CSN: [id number] Arrival date & time: 11/16/20  1055      History   Chief Complaint Chief Complaint  Patient presents with   Nasal Congestion    APPOINTMENT   Facial Pain    HPI Kristen Sandoval is a 65 y.o. female.  Patient presents with 10-day history of sinus congestion, sinus pressure, green nasal discharge, runny nose.  The sinus pressure is now causing pain in her teeth.  Treatment at home with Mucinex.  She denies fever, chills, facial swelling, ear pain, sore throat, cough, or other symptoms.  She reports history of sinus infections and sinus surgery.  Her medical history includes asthma.  The history is provided by the patient and medical records.   Past Medical History:  Diagnosis Date   Allergy    Anxiety    Arthritis    Asthma    GERD (gastroesophageal reflux disease)     Patient Active Problem List   Diagnosis Date Noted   Elevated blood pressure reading 07/26/2019   Bilateral hand pain 11/12/2018   Arthritis of carpometacarpal [facility]) joint of right thumb 12/19/2017   Carpal tunnel syndrome of right wrist 12/19/2017   Bronchitis 05/15/2017   Routine physical examination 10/18/2016   Screening for breast cancer 10/18/2016   Anxiety 02/10/2015   Osteopenia 02/10/2015   Herpes zona 02/10/2015   Nonspecific abnormal finding in stool contents 02/10/2015   Myxoid cyst 02/10/2015   Airway hyperreactivity 07/16/2008   Avitaminosis D 09/02/2007   Acid reflux 04/01/2007   Bone/cartilage disorder 11/08/2006   Anxiety and depression 10/20/2006   H/O: pituitary tumor 10/20/2006   Hypercholesterolemia without hypertriglyceridemia 10/20/2006    Past Surgical History:  Procedure Laterality Date   ABDOMINAL HYSTERECTOMY  1990's    for noncancerous reasons, ovarian cyst and menorrhagia; NO cervix on exam   CARPAL TUNNEL RELEASE     COLONOSCOPY  2012   in [place], [place]-Normal exam per pt   KNEE SURGERY Left 09/2009   OOPHORECTOMY Bilateral  1990's   TONSILLECTOMY AND ADENOIDECTOMY     UPPER GASTROINTESTINAL ENDOSCOPY  2012   in [place]    OB History   No obstetric history on file.      Home Medications    Prior to Admission medications   Medication Sig Start Date End Date Taking? Authorizing Provider  amoxicillin (AMOXIL) 875 MG tablet Take 1 tablet (875 mg total) by mouth 2 (two) times daily for 10 days. 11/16/20 11/26/20 Yes Sharion Balloon, NP  escitalopram (LEXAPRO) 10 MG tablet Take 1 tablet (10 mg total) by mouth daily. 09/04/20  Yes Burnard Hawthorne, FNP  Multiple Vitamin (MULTIVITAMIN) tablet Take 1 tablet by mouth daily.   Yes [provider]  VENTOLIN HFA 108 (90 Base) MCG/ACT inhaler INHALE 2 PUFFS INTO LUNGS EVERY 6 HOURS AS NEEDED FOR WHEEZING OR SHORTNESS OF BREATH 04/25/19  Yes Burnard Hawthorne, FNP  fluticasone (FLONASE) 50 MCG/ACT nasal spray TAKE 1 SPRAY INTO BOTH NOSTRILS EVERY DAY 04/25/19   Burnard Hawthorne, FNP    Family History Family History  Adopted: Yes  Problem Relation Age of Onset   Lung cancer Mother    Breast cancer Neg Hx     Social History Social History   Tobacco Use   Smoking status: Former    Packs/day: 0.25    Years: 31.00    Pack years: 7.75    Types: Cigarettes    Quit date: 02/27/2006  Years since quitting: 14.7   Smokeless tobacco: Never   Tobacco comments:    2006  Substance Use Topics   Alcohol use: Yes    Alcohol/week: 8.0 standard drinks    Types: 8 Standard drinks or equivalent per week    Comment: Moderate use   Drug use: No     Allergies   Biaxin [clarithromycin], Erythromycin, and Latex   Review of Systems Review of Systems  Constitutional:  Negative for chills and fever.  HENT:  Positive for congestion, postnasal drip, rhinorrhea, sinus pressure and sinus pain. Negative for ear pain and sore throat.   Respiratory:  Negative for cough and shortness of breath.   Cardiovascular:  Negative for chest pain and palpitations.   Gastrointestinal:  Negative for abdominal pain and vomiting.  Skin:  Negative for color change and rash.  All other systems reviewed and are negative.   Physical Exam Triage Vital Signs ED Triage Vitals  Enc Vitals Group     BP      Pulse      Resp      Temp      Temp src      SpO2      Weight      Height      Head Circumference      Peak Flow      Pain Score      Pain Loc      Pain Edu?      Excl. in [place]?    No data found.  Updated Vital Signs BP (!) 147/87 (BP Location: Left Arm)   Pulse (!) 101   Temp 99.4 F (37.4 C) (Oral)   Resp 18   SpO2 95%   Visual Acuity Right Eye Distance:   Left Eye Distance:   Bilateral Distance:    Right Eye Near:   Left Eye Near:    Bilateral Near:     Physical Exam Vitals and nursing note reviewed.  Constitutional:      General: She is not in acute distress.    Appearance: She is well-developed.  HENT:     Head: Normocephalic and atraumatic.     Right Ear: Tympanic membrane normal.     Left Ear: Tympanic membrane normal.     Nose: Congestion present.     Mouth/Throat:     Mouth: Mucous membranes are moist.     Pharynx: Oropharynx is clear.  Eyes:     Conjunctiva/sclera: Conjunctivae normal.  Cardiovascular:     Rate and Rhythm: Normal rate and regular rhythm.     Heart sounds: Normal heart sounds.  Pulmonary:     Effort: Pulmonary effort is normal. No respiratory distress.     Breath sounds: Normal breath sounds.  Abdominal:     Palpations: Abdomen is soft.     Tenderness: There is no abdominal tenderness.  Musculoskeletal:     Cervical back: Neck supple.  Skin:    General: Skin is warm and dry.  Neurological:     General: No focal deficit present.     Mental Status: She is alert and oriented to person, place, and time.  Psychiatric:        Mood and Affect: Mood normal.        Behavior: Behavior normal.     UC Treatments / Results  Labs (all labs ordered are listed, but only abnormal results are  displayed) Labs Reviewed - No data to display  EKG   Radiology No results found.  Procedures Procedures (including critical care time)  Medications Ordered in UC Medications - No data to display  Initial Impression / Assessment and Plan / UC Course  I have reviewed the triage vital signs and the nursing notes.  Pertinent labs & imaging results that were available during my care of the patient were reviewed by me and considered in my medical decision making (see chart for details).  Acute sinusitis.  Treating with amoxicillin.  Instructed patient to continue Mucinex and ibuprofen.  Instructed her to follow-up with her PCP if her symptoms are not improving.  She agrees to plan of care.   Final Clinical Impressions(s) / UC Diagnoses   Final diagnoses:  Acute non-recurrent maxillary sinusitis     Discharge Instructions      Take amoxicillin as directed.    Follow up with your primary care provider if your symptoms are not improving.         ED Prescriptions     Medication Sig Dispense Auth. Provider   amoxicillin (AMOXIL) 875 MG tablet Take 1 tablet (875 mg total) by mouth 2 (two) times daily for 10 days. 20 tablet Sharion Balloon, NP      PDMP not reviewed this encounter.   Sharion Balloon, NP 11/16/20 1120

## 2020-11-16 NOTE — Telephone Encounter (Signed)
Kristen Sandoval called into access nurse stating that she is having tooth pain, congestion, cough, and a headache. Pt states that she is negative for covid and has taken 3 at home covid tests. Pt is currently being seen at [facility] urgent care in [place].

## 2020-11-19 ENCOUNTER — Encounter: Payer: Self-pay | Admitting: Family

## 2020-11-20 MED ORDER — BENZONATATE 200 MG PO CAPS: 200.0000 mg | ORAL_CAPSULE | Freq: Two times a day (BID) | ORAL | 0 refills | Status: DC | PRN

## 2021-01-08 ENCOUNTER — Ambulatory Visit
Admission: RE | Admit: 2021-01-08 | Discharge: 2021-01-08 | Disposition: A | Discharge status: Home / Self Care | Payer: BC Managed Care – PPO | Source: Ambulatory Visit | Attending: Emergency Medicine | Admitting: Emergency Medicine

## 2021-01-08 ENCOUNTER — Other Ambulatory Visit: Payer: Self-pay

## 2021-01-08 ENCOUNTER — Other Ambulatory Visit: Payer: Self-pay | Admitting: Family

## 2021-01-08 VITALS — BP 146/90 | HR 93 | Temp 98.4°F | Resp 18

## 2021-01-08 DIAGNOSIS — F32A Depression, unspecified: Secondary | ICD-10-CM

## 2021-01-08 DIAGNOSIS — F419 Anxiety disorder, unspecified: Secondary | ICD-10-CM

## 2021-01-08 DIAGNOSIS — B9689 Other specified bacterial agents as the cause of diseases classified elsewhere: Secondary | ICD-10-CM | POA: Diagnosis not present

## 2021-01-08 DIAGNOSIS — J329 Chronic sinusitis, unspecified: Secondary | ICD-10-CM | POA: Diagnosis not present

## 2021-01-08 MED ORDER — CEFDINIR 300 MG PO CAPS: 300.0000 mg | ORAL_CAPSULE | Freq: Two times a day (BID) | ORAL | 0 refills | Status: AC

## 2021-01-08 NOTE — ED Provider Notes (Signed)
CHIEF COMPLAINT:   Chief Complaint  Patient presents with   Cough   Nasal Congestion     SUBJECTIVE/HPI:   Cough A very pleasant [name] presents today with sinus pressure and congestion along with cough for the last 6 days.  Patient reports that this is consistent with presentations in the past when she has had sinus infections.. Patient does not report any shortness of breath, chest pain, palpitations, visual changes, weakness, tingling, headache, nausea, vomiting, diarrhea, fever, chills.   has a past medical history of Allergy, Anxiety, Arthritis, Asthma, and GERD (gastroesophageal reflux disease).  ROS:  Review of Systems  Respiratory:  Positive for cough.   See Subjective/HPI Medications, Allergies and Problem List personally reviewed in Epic today OBJECTIVE:   Vitals:   01/08/21 1211  BP: (!) 146/90  Pulse: 93  Resp: 18  Temp: 98.4 F (36.9 C)  SpO2: 95%    Physical Exam   General: Appears well-developed and well-nourished. No acute distress.  HEENT Head: Normocephalic and atraumatic.  + frontal sinus tenderness noted to palpation. Ears: Hearing grossly intact, no drainage or visible deformity.  Nose: No nasal deviation.   Mouth/Throat: No stridor or tracheal deviation.  Non erythematous posterior pharynx noted with clear drainage present.  No white patchy exudate noted. Eyes: Conjunctivae and EOM are normal. No eye drainage or scleral icterus bilaterally.  Neck: Normal range of motion, neck is supple.  Cardiovascular: Normal rate. Regular rhythm; no murmurs, gallops, or rubs.  Pulm/Chest: No respiratory distress. Breath sounds normal bilaterally without wheezes, rhonchi, or rales.  Neurological: Alert and oriented to person, place, and time.  Skin: Skin is warm and dry.  No rashes, lesions, abrasions or bruising noted to skin.   Psychiatric: Normal mood, affect, behavior, and thought content.   Vital signs and nursing note reviewed.   Patient stable and  cooperative with examination. PROCEDURES:    LABS/X-RAYS/EKG/MEDS:   No results found for any visits on 01/08/21.  MEDICAL DECISION MAKING:   Patient presents with sinus pressure and congestion along with cough for the last 6 days.  Patient reports that this is consistent with presentations in the past when she has had sinus infections.. Patient does not report any shortness of breath, chest pain, palpitations, visual changes, weakness, tingling, headache, nausea, vomiting, diarrhea, fever, chills.  Given symptoms along with assessment findings, likely bacterial sinusitis.  Rx cefdinir to the patient's preferred pharmacy. Advised rest, fluids, Tylenol, ibuprofen, Mucinex and Sudafed.  Follow-up with PCP if not improving over the next 5 to 7 days.  Return for any new high fever not improved with medications, chest pain, difficulty breathing, nonstop vomiting or coughing up blood.  Patient verbalized understanding and agreed with treatment plan.  Patient stable upon discharge. ASSESSMENT/PLAN:  1. Bacterial sinusitis - cefdinir (OMNICEF) 300 MG capsule; Take 1 capsule (300 mg total) by mouth 2 (two) times daily for 7 days.  Dispense: 14 capsule; Refill: 0 Instructions about new medications and side effects provided.  Plan:   Discharge Instructions      Sinusitis is an infection of the lining of the sinus cavities in your head. Sinusitis often follows a cold. It causes pain and pressure in your head and face. Take antibiotics (Cefdinir) as directed. Do not stop taking them just because you feel better. You need to take the full course of antibiotics. Rest, push lots of fluids (especially water), and utilize supportive care for symptoms. Breathe warm, moist area from a steamy shower, hot bath, or  sink filled with hot water.  Avoid cold, dry air.  Using a humidifier in your home may help.  Follow the directions for cleaning the machine. Put a hot, wet towel or a warm gel pack on your face 3-4  times a day for 5-10 minutes each time. You may take acetaminophen (Tylenol) every 4-6 hours and ibuprofen every 6-8 hours for muscle pain, joint pain, headaches (you may also alternate these medications). Sudafed (pseudophedrine) is sold behind the counter and can help reduce nasal pressure; avoid taking this if you have high blood pressure or feel jittery. Sudafed PE (phenylephrine) can be a helpful, short-term, over-the-counter alternative to limit side effects or if you have high blood pressure.  Flonase nasal spray can help alleviate congestion and sinus pressure. Many patients choose Afrin as a nasal decongestant; do not use for more than 3 days for risk of rebound (increased symptoms after stopping medication).  Saline nasal sprays or rinses can also help nasal congestion (use bottled or sterile water). Warm tea with lemon and honey can sooth sore throat and cough, as can cough drops.   Return to clinic for new or worse swelling or redness in your face or around your eyes, or if you have a new or higher fever.          Kristen Sandoval, [place] 01/08/21 1238

## 2021-01-08 NOTE — Discharge Instructions (Addendum)
Sinusitis is an infection of the lining of the sinus cavities in your head. Sinusitis often follows a cold. It causes pain and pressure in your head and face. Take antibiotics (Cefdinir) as directed. Do not stop taking them just because you feel better. You need to take the full course of antibiotics. Rest, push lots of fluids (especially water), and utilize supportive care for symptoms. Breathe warm, moist area from a steamy shower, hot bath, or sink filled with hot water.  Avoid cold, dry air.  Using a humidifier in your home may help.  Follow the directions for cleaning the machine. Put a hot, wet towel or a warm gel pack on your face 3-4 times a day for 5-10 minutes each time. You may take acetaminophen (Tylenol) every 4-6 hours and ibuprofen every 6-8 hours for muscle pain, joint pain, headaches (you may also alternate these medications). Sudafed (pseudophedrine) is sold behind the counter and can help reduce nasal pressure; avoid taking this if you have high blood pressure or feel jittery. Sudafed PE (phenylephrine) can be a helpful, short-term, over-the-counter alternative to limit side effects or if you have high blood pressure.  Flonase nasal spray can help alleviate congestion and sinus pressure. Many patients choose Afrin as a nasal decongestant; do not use for more than 3 days for risk of rebound (increased symptoms after stopping medication).  Saline nasal sprays or rinses can also help nasal congestion (use bottled or sterile water). Warm tea with lemon and honey can sooth sore throat and cough, as can cough drops.   Return to clinic for new or worse swelling or redness in your face or around your eyes, or if you have a new or higher fever.

## 2021-01-08 NOTE — ED Triage Notes (Signed)
Pt here with sinus pressure and congestion with cough x 6 days.

## 2021-04-07 DIAGNOSIS — M1711 Unilateral primary osteoarthritis, right knee: Secondary | ICD-10-CM | POA: Diagnosis not present

## 2021-06-15 DIAGNOSIS — F411 Generalized anxiety disorder: Secondary | ICD-10-CM | POA: Diagnosis not present

## 2021-06-22 DIAGNOSIS — F411 Generalized anxiety disorder: Secondary | ICD-10-CM | POA: Diagnosis not present

## 2021-07-30 DIAGNOSIS — F411 Generalized anxiety disorder: Secondary | ICD-10-CM | POA: Diagnosis not present

## 2021-08-10 DIAGNOSIS — F411 Generalized anxiety disorder: Secondary | ICD-10-CM | POA: Diagnosis not present

## 2021-08-27 DIAGNOSIS — F411 Generalized anxiety disorder: Secondary | ICD-10-CM | POA: Diagnosis not present

## 2021-11-03 ENCOUNTER — Encounter: Payer: Self-pay | Admitting: Family

## 2021-11-03 ENCOUNTER — Ambulatory Visit (INDEPENDENT_AMBULATORY_CARE_PROVIDER_SITE_OTHER): Payer: BC Managed Care – PPO | Admitting: Family

## 2021-11-03 VITALS — BP 118/64 | HR 64 | Temp 98.1°F | Ht 61.0 in | Wt 123.8 lb

## 2021-11-03 DIAGNOSIS — J4520 Mild intermittent asthma, uncomplicated: Secondary | ICD-10-CM

## 2021-11-03 DIAGNOSIS — F419 Anxiety disorder, unspecified: Secondary | ICD-10-CM | POA: Diagnosis not present

## 2021-11-03 DIAGNOSIS — Z87898 Personal history of other specified conditions: Secondary | ICD-10-CM | POA: Diagnosis not present

## 2021-11-03 DIAGNOSIS — Z0000 Encounter for general adult medical examination without abnormal findings: Secondary | ICD-10-CM | POA: Diagnosis not present

## 2021-11-03 DIAGNOSIS — F32A Depression, unspecified: Secondary | ICD-10-CM

## 2021-11-03 MED ORDER — ALBUTEROL SULFATE HFA 108 (90 BASE) MCG/ACT IN AERS: INHALATION_SPRAY | RESPIRATORY_TRACT | 3 refills | Status: DC

## 2021-11-03 MED ORDER — FLUOXETINE HCL 20 MG PO CAPS: 20.0000 mg | ORAL_CAPSULE | Freq: Every morning | ORAL | 3 refills | Status: DC

## 2021-11-03 NOTE — Assessment & Plan Note (Signed)
Suboptimal control.  We agreed to stop Lexapro, start Prozac 20 mg to see if nausea and headache completely resolve.  I provided her with [facility] counselor information to contact

## 2021-11-03 NOTE — Assessment & Plan Note (Signed)
Clinical breast exam performed today.  Patient will schedule mammogram.  Deferred pelvic exam in the absence of complaints the patient does not have a cervix.  Encouraged exercise.

## 2021-11-03 NOTE — Patient Instructions (Addendum)
Stop lexapro as of today  Start prozac tomorrow  Please call  and schedule your 3D mammogram and /or bone density scan as we discussed.   [facility]  ( new location in 2023)  [address], [place], [place] [address]  [place], [place]  As discussed, please call Otila Kluver and see if she is taking new patients.   Miguel Dibble , counselor   [place], [place], [place] [address] Phone: [phone]  Health Maintenance for Postmenopausal Women Menopause is a normal process in which your ability to get pregnant comes to an end. This process happens slowly over many months or years, usually between the ages of 50 and 65. Menopause is complete when you have missed your menstrual period for 12 months. It is important to talk with your health care provider about some of the most common conditions that affect women after menopause (postmenopausal women). These include heart disease, cancer, and bone loss (osteoporosis). Adopting a healthy lifestyle and getting preventive care can help to promote your health and wellness. The actions you take can also lower your chances of developing some of these common conditions. What are the signs and symptoms of menopause? During menopause, you may have the following symptoms: Hot flashes. These can be moderate or severe. Night sweats. Decrease in sex drive. Mood swings. Headaches. Tiredness (fatigue). Irritability. Memory problems. Problems falling asleep or staying asleep. Talk with your health care provider about treatment options for your symptoms. Do I need hormone replacement therapy? Hormone replacement therapy is effective in treating symptoms that are caused by menopause, such as hot flashes and night sweats. Hormone replacement carries certain risks, especially as you become older. If you are thinking about using estrogen or estrogen with progestin, discuss the benefits and risks with your health  care provider. How can I reduce my risk for heart disease and stroke? The risk of heart disease, heart attack, and stroke increases as you age. One of the causes may be a change in the body's hormones during menopause. This can affect how your body uses dietary fats, triglycerides, and cholesterol. Heart attack and stroke are medical emergencies. There are many things that you can do to help prevent heart disease and stroke. Watch your blood pressure High blood pressure causes heart disease and increases the risk of stroke. This is more likely to develop in people who have high blood pressure readings or are overweight. Have your blood pressure checked: Every 3-5 years if you are 73-83 years of age. Every year if you are 66 years old or older. Eat a healthy diet  Eat a diet that includes plenty of vegetables, fruits, low-fat dairy products, and lean protein. Do not eat a lot of foods that are high in solid fats, added sugars, or sodium. Get regular exercise Get regular exercise. This is one of the most important things you can do for your health. Most adults should: Try to exercise for at least 150 minutes each week. The exercise should increase your heart rate and make you sweat (moderate-intensity exercise). Try to do strengthening exercises at least twice each week. Do these in addition to the moderate-intensity exercise. Spend less time sitting. Even light physical activity can be beneficial. Other tips Work with your health care provider to achieve or maintain a healthy weight. Do not use any products that contain nicotine or tobacco. These products include cigarettes, chewing tobacco, and vaping devices, such as e-cigarettes. If you need help quitting,  ask your health care provider. Know your numbers. Ask your health care provider to check your cholesterol and your blood sugar (glucose). Continue to have your blood tested as directed by your health care provider. Do I need screening for  cancer? Depending on your health history and family history, you may need to have cancer screenings at different stages of your life. This may include screening for: Breast cancer. Cervical cancer. Lung cancer. Colorectal cancer. What is my risk for osteoporosis? After menopause, you may be at increased risk for osteoporosis. Osteoporosis is a condition in which bone destruction happens more quickly than new bone creation. To help prevent osteoporosis or the bone fractures that can happen because of osteoporosis, you may take the following actions: If you are 63-100 years old, get at least 1,000 mg of calcium and at least 600 international units (IU) of vitamin D per day. If you are older than age 36 but younger than age 48, get at least 1,200 mg of calcium and at least 600 international units (IU) of vitamin D per day. If you are older than age 54, get at least 1,200 mg of calcium and at least 800 international units (IU) of vitamin D per day. Smoking and drinking excessive alcohol increase the risk of osteoporosis. Eat foods that are rich in calcium and vitamin D, and do weight-bearing exercises several times each week as directed by your health care provider. How does menopause affect my mental health? Depression may occur at any age, but it is more common as you become older. Common symptoms of depression include: Feeling depressed. Changes in sleep patterns. Changes in appetite or eating patterns. Feeling an overall lack of motivation or enjoyment of activities that you previously enjoyed. Frequent crying spells. Talk with your health care provider if you think that you are experiencing any of these symptoms. General instructions See your health care provider for regular wellness exams and vaccines. This may include: Scheduling regular health, dental, and eye exams. Getting and maintaining your vaccines. These include: Influenza vaccine. Get this vaccine each year before the flu season  begins. Pneumonia vaccine. Shingles vaccine. Tetanus, diphtheria, and pertussis (Tdap) booster vaccine. Your health care provider may also recommend other immunizations. Tell your health care provider if you have ever been abused or do not feel safe at home. Summary Menopause is a normal process in which your ability to get pregnant comes to an end. This condition causes hot flashes, night sweats, decreased interest in sex, mood swings, headaches, or lack of sleep. Treatment for this condition may include hormone replacement therapy. Take actions to keep yourself healthy, including exercising regularly, eating a healthy diet, watching your weight, and checking your blood pressure and blood sugar levels. Get screened for cancer and depression. Make sure that you are up to date with all your vaccines. This information is not intended to replace advice given to you by your health care provider. Make sure you discuss any questions you have with your health care provider. Document Revised: 07/06/2020 Document Reviewed: 07/06/2020 Elsevier Patient Education  [place].

## 2021-11-03 NOTE — Progress Notes (Signed)
Subjective:    Patient ID: Kristen Sandoval, female    DOB: 06/05/1955, 66 y.o.   MRN: [id number]  CC: Kristen Sandoval is a 66 y.o. female who presents today for physical exam.    HPI: Anxiety and depression-she feels anxiety at times.  Lexapro has been mediocre. She feels nausea and HA and attributes to lexapro '10mg'$ .   She had previously been on zoloft.   She was doing counseling which was helpful    H/o pituitary adenoma 1980's.  She had treated with 'paralodel', not surgery.  Denies vision changes  History of airway hypersensitivity.  She has not required her inhaler in quite some time.  She would like refill to  keep on hand  Colorectal Cancer Screening: UTD ,repeat in 3 years; due 10/14/2023.  Breast Cancer Screening: Mammogram due Cervical Cancer Screening: Hysterectomy for noncancerous reasons, ovarian cyst and menorrhagia.  No cervix on exam 10/18/2016  Bone Health screening/DEXA for 65+: No increased fracture risk. Defer screening at this time.  Lung Cancer Screening: Doesn't have 20 year pack year history and age > 69 years yo 72 years. Quit in 2006          Tetanus - UTD       Labs: Screening labs today. Exercise: Gets regular exercise, strength training Alcohol use:  moderate use Smoking/tobacco use: former smoker.     HISTORY:  Past Medical History:  Diagnosis Date   Allergy    Anxiety    Arthritis    Asthma    GERD (gastroesophageal reflux disease)     Past Surgical History:  Procedure Laterality Date   ABDOMINAL HYSTERECTOMY  1990's    for noncancerous reasons, ovarian cyst and menorrhagia; NO cervix on exam   CARPAL TUNNEL RELEASE     COLONOSCOPY  2012   in [place], -Normal exam per pt   KNEE SURGERY Left 09/2009   OOPHORECTOMY Bilateral 19's   TONSILLECTOMY AND ADENOIDECTOMY     UPPER GASTROINTESTINAL ENDOSCOPY  2012   in [place]   Family History  Adopted: Yes  Problem Relation Age of Onset   Lung cancer Mother    Breast cancer Neg Hx        ALLERGIES: Biaxin [clarithromycin], Erythromycin, and Latex  Current Outpatient Medications on File Prior to Visit  Medication Sig Dispense Refill   fluticasone (FLONASE) 50 MCG/ACT nasal spray TAKE 1 SPRAY INTO BOTH NOSTRILS EVERY DAY 16 g 1   Multiple Vitamin (MULTIVITAMIN) tablet Take 1 tablet by mouth daily.     benzonatate (TESSALON) 200 MG capsule Take 1 capsule (200 mg total) by mouth 2 (two) times daily as needed for cough. (Patient not taking: Reported on 11/03/2021) 20 capsule 0   No current facility-administered medications on file prior to visit.    Social History   Tobacco Use   Smoking status: Former    Packs/day: 0.25    Years: 31.00    Total pack years: 7.75    Types: Cigarettes    Quit date: 02/27/2006    Years since quitting: 15.6   Smokeless tobacco: Never   Tobacco comments:    2006  Substance Use Topics   Alcohol use: Yes    Alcohol/week: 8.0 standard drinks of alcohol    Types: 8 Standard drinks or equivalent per week    Comment: Moderate use   Drug use: No    Review of Systems  Constitutional:  Negative for chills, fever and unexpected weight change.  HENT:  Negative for  congestion.   Respiratory:  Negative for cough.   Cardiovascular:  Negative for chest pain, palpitations and leg swelling.  Gastrointestinal:  Negative for nausea and vomiting.  Musculoskeletal:  Negative for arthralgias and myalgias.  Skin:  Negative for rash.  Neurological:  Negative for headaches.  Hematological:  Negative for adenopathy.  Psychiatric/Behavioral:  Negative for confusion.       Objective:    BP 118/64 (BP Location: Left Arm, Patient Position: Sitting, Cuff Size: Normal)   Pulse 64   Temp 98.1 F (36.7 C) (Oral)   Ht '5\' 1"'$  (1.549 m)   Wt 123 lb 12.8 oz (56.2 kg)   SpO2 97%   BMI 23.39 kg/m   BP Readings from Last 3 Encounters:  11/03/21 118/64  01/08/21 (!) 146/90  11/16/20 (!) 147/87   Wt Readings from Last 3 Encounters:  11/03/21 123 lb 12.8  oz (56.2 kg)  10/30/20 116 lb (52.6 kg)  10/13/20 108 lb (49 kg)    Physical Exam Vitals reviewed.  Constitutional:      Appearance: Normal appearance. She is well-developed.  Eyes:     Conjunctiva/sclera: Conjunctivae normal.  Neck:     Thyroid: No thyroid mass or thyromegaly.  Cardiovascular:     Rate and Rhythm: Normal rate and regular rhythm.     Pulses: Normal pulses.     Heart sounds: Normal heart sounds.  Pulmonary:     Effort: Pulmonary effort is normal.     Breath sounds: Normal breath sounds. No wheezing, rhonchi or rales.  Chest:  Breasts:    Breasts are symmetrical.     Right: No inverted nipple, mass, nipple discharge, skin change or tenderness.     Left: No inverted nipple, mass, nipple discharge, skin change or tenderness.  Abdominal:     General: Bowel sounds are normal. There is no distension.     Palpations: Abdomen is soft. Abdomen is not rigid. There is no fluid wave or mass.     Tenderness: There is no abdominal tenderness. There is no guarding or rebound.  Lymphadenopathy:     Head:     Right side of head: No submental, submandibular, tonsillar, preauricular, posterior auricular or occipital adenopathy.     Left side of head: No submental, submandibular, tonsillar, preauricular, posterior auricular or occipital adenopathy.     Cervical: No cervical adenopathy.     Right cervical: No superficial, deep or posterior cervical adenopathy.    Left cervical: No superficial, deep or posterior cervical adenopathy.  Skin:    General: Skin is warm and dry.  Neurological:     Mental Status: She is alert.  Psychiatric:        Speech: Speech normal.        Behavior: Behavior normal.        Thought Content: Thought content normal.        Assessment & Plan:   Problem List Items Addressed This Visit       Respiratory   Airway hyperreactivity   Relevant Medications   albuterol (VENTOLIN HFA) 108 (90 Base) MCG/ACT inhaler     Endocrine   H/O: pituitary  tumor    Discussed headache, patient thinks more likely from Lexapro.  She politely declines MRI brain.  Pending prolactin      Relevant Orders   Prolactin     Other   Anxiety and depression    Suboptimal control.  We agreed to stop Lexapro, start Prozac 20 mg to see if nausea and headache  completely resolve.  I provided her with Miguel Dibble counselor information to contact      Relevant Medications   FLUoxetine (PROZAC) 20 MG capsule   Routine physical examination - Primary    Clinical breast exam performed today.  Patient will schedule mammogram.  Deferred pelvic exam in the absence of complaints the patient does not have a cervix.  Encouraged exercise.      Relevant Orders   CBC with Differential/Platelet   Comprehensive metabolic panel   Hemoglobin A1c   Lipid panel   TSH   VITAMIN D 25 Hydroxy (Vit-D Deficiency, Fractures)   MM 3D SCREEN BREAST BILATERAL     I have discontinued Lattie Haw B. Gunnarson's escitalopram. I have also changed her Ventolin HFA to albuterol. Additionally, I am having her start on FLUoxetine. Lastly, I am having her maintain her fluticasone, multivitamin, and benzonatate.   Meds ordered this encounter  Medications   FLUoxetine (PROZAC) 20 MG capsule    Sig: Take 1 capsule (20 mg total) by mouth every morning.    Dispense:  90 capsule    Refill:  3    Order Specific Question:   Supervising Provider    Answer:   Deborra Medina L [[id number]]   albuterol (VENTOLIN HFA) 108 (90 Base) MCG/ACT inhaler    Sig: INHALE 2 PUFFS INTO LUNGS EVERY 6 HOURS AS NEEDED FOR WHEEZING OR SHORTNESS OF BREATH    Dispense:  18 g    Refill:  3    Order Specific Question:   Supervising Provider    Answer:   Crecencio Mc [[id number]]    Return precautions given.   Risks, benefits, and alternatives of the medications and treatment plan prescribed today were discussed, and patient expressed understanding.   Education regarding symptom management and diagnosis given to patient on  AVS.   Continue to follow with Burnard Hawthorne, FNP for routine health maintenance.   Bubba Camp and I agreed with plan.   Mable Paris, FNP

## 2021-11-03 NOTE — Assessment & Plan Note (Signed)
Discussed headache, patient thinks more likely from Lexapro.  She politely declines MRI brain.  Pending prolactin

## 2021-11-04 DIAGNOSIS — Z87898 Personal history of other specified conditions: Secondary | ICD-10-CM | POA: Diagnosis not present

## 2021-11-04 LAB — COMPREHENSIVE METABOLIC PANEL
ALT: 17 U/L (ref 0–35)
AST: 22 U/L (ref 0–37)
Albumin: 4.1 g/dL (ref 3.5–5.2)
Alkaline Phosphatase: 83 U/L (ref 39–117)
BUN: 25 mg/dL — ABNORMAL HIGH (ref 6–23)
CO2: 24 mEq/L (ref 19–32)
Calcium: 9.1 mg/dL (ref 8.4–10.5)
Chloride: 104 mEq/L (ref 96–112)
Creatinine, Ser: 0.95 mg/dL (ref 0.40–1.20)
GFR: 63.56 mL/min (ref >60.00)
Glucose, Bld: 85 mg/dL (ref 70–99)
Potassium: 4.1 mEq/L (ref 3.5–5.1)
Sodium: 139 mEq/L (ref 135–145)
Total Bilirubin: 0.4 mg/dL (ref 0.2–1.2)
Total Protein: 7.4 g/dL (ref 6.0–8.3)

## 2021-11-04 LAB — CBC WITH DIFFERENTIAL/PLATELET
Basophils Absolute: 0 10*3/uL (ref 0.0–0.1)
Basophils Relative: 0.9 % (ref 0.0–3.0)
Eosinophils Absolute: 0.2 10*3/uL (ref 0.0–0.7)
Eosinophils Relative: 3.3 % (ref 0.0–5.0)
HCT: 37.7 % (ref 36.0–46.0)
Hemoglobin: 12.7 g/dL (ref 12.0–15.0)
Lymphocytes Relative: 32.2 % (ref 12.0–46.0)
Lymphs Abs: 1.5 10*3/uL (ref 0.7–4.0)
MCHC: 33.6 g/dL (ref 30.0–36.0)
MCV: 92.8 fl (ref 78.0–100.0)
Monocytes Absolute: 0.4 10*3/uL (ref 0.1–1.0)
Monocytes Relative: 7.8 % (ref 3.0–12.0)
Neutro Abs: 2.7 10*3/uL (ref 1.4–7.7)
Neutrophils Relative %: 55.8 % (ref 43.0–77.0)
Platelets: 227 10*3/uL (ref 150.0–400.0)
RBC: 4.07 Mil/uL (ref 3.87–5.11)
RDW: 12.5 % (ref 11.5–15.5)
WBC: 4.8 10*3/uL (ref 4.0–10.5)

## 2021-11-04 LAB — LIPID PANEL
Cholesterol: 242 mg/dL — ABNORMAL HIGH (ref 0–200)
HDL: 86.3 mg/dL (ref >39.00)
LDL Cholesterol: 123 mg/dL — ABNORMAL HIGH (ref 0–99)
NonHDL: 155.59
Total CHOL/HDL Ratio: 3
Triglycerides: 164 mg/dL — ABNORMAL HIGH (ref 0.0–149.0)
VLDL: 32.8 mg/dL (ref 0.0–40.0)

## 2021-11-04 LAB — HEMOGLOBIN A1C: Hgb A1c MFr Bld: 5.9 % (ref 4.6–6.5)

## 2021-11-04 LAB — TSH: TSH: 1.17 u[IU]/mL (ref 0.35–5.50)

## 2021-11-04 LAB — VITAMIN D 25 HYDROXY (VIT D DEFICIENCY, FRACTURES): VITD: 24.19 ng/mL — ABNORMAL LOW (ref 30.00–100.00)

## 2021-11-04 NOTE — Addendum Note (Signed)
Addended by: Neta Ehlers on: 11/04/2021 07:42 AM   Modules accepted: Orders

## 2021-11-04 NOTE — Addendum Note (Signed)
Addended by: Neta Ehlers on: 11/04/2021 07:46 AM   Modules accepted: Orders

## 2021-11-05 LAB — PROLACTIN: Prolactin: 6.1 ng/mL

## 2021-11-26 DIAGNOSIS — M79641 Pain in right hand: Secondary | ICD-10-CM | POA: Diagnosis not present

## 2021-12-06 ENCOUNTER — Ambulatory Visit
Admission: RE | Admit: 2021-12-06 | Discharge: 2021-12-06 | Disposition: A | Discharge status: Home / Self Care | Payer: BC Managed Care – PPO | Source: Ambulatory Visit | Attending: Family | Admitting: Family

## 2021-12-06 DIAGNOSIS — Z1231 Encounter for screening mammogram for malignant neoplasm of breast: Secondary | ICD-10-CM | POA: Diagnosis not present

## 2021-12-06 DIAGNOSIS — Z0000 Encounter for general adult medical examination without abnormal findings: Secondary | ICD-10-CM | POA: Insufficient documentation

## 2021-12-08 DIAGNOSIS — M65331 Trigger finger, right middle finger: Secondary | ICD-10-CM | POA: Diagnosis not present

## 2021-12-15 ENCOUNTER — Ambulatory Visit (INDEPENDENT_AMBULATORY_CARE_PROVIDER_SITE_OTHER): Payer: BC Managed Care – PPO | Admitting: Family

## 2021-12-15 DIAGNOSIS — F32A Depression, unspecified: Secondary | ICD-10-CM | POA: Diagnosis not present

## 2021-12-15 DIAGNOSIS — F419 Anxiety disorder, unspecified: Secondary | ICD-10-CM | POA: Diagnosis not present

## 2021-12-15 DIAGNOSIS — Z87898 Personal history of other specified conditions: Secondary | ICD-10-CM

## 2021-12-15 DIAGNOSIS — F411 Generalized anxiety disorder: Secondary | ICD-10-CM | POA: Diagnosis not present

## 2021-12-15 NOTE — Assessment & Plan Note (Signed)
Improved. Continue prozac '20mg'$ . She is starting counseling today.

## 2021-12-15 NOTE — Progress Notes (Addendum)
Verbal consent for services obtained from patient prior to services given to TELEPHONE visit:   Location of call:  provider at work patient at home  Names of all persons present for services: Kristen Paris, NP and patient Medication Follow up  She feels prozac has been helpful for anxiety. She has not had nausea. HA are not bothersome and not worse. HA are related to stress. HA are unchanged from prior.  Exercise and massage are helpful.  Started Prozac 20 mg to see if nausea and headache resolved on 11/03/2021. She is no longer on Lexapro  She is meeting with counselor today.   Last neuroimaging over 20+ years ago. She declines neuroimaging.   A/P/next steps:  Problem List Items Addressed This Visit       Endocrine   H/O: pituitary tumor    Again she declines neuroimaging. HA are not bothersome. Prolactin level 6.1  Obtained 11/04/2021. Will follow        Other   Anxiety and depression    Improved. Continue prozac '20mg'$ . She is starting counseling today.         I spent 15 min  discussing plan of care over the phone.

## 2021-12-15 NOTE — Assessment & Plan Note (Addendum)
Again she declines neuroimaging. HA are not bothersome. Prolactin level 6.1  Obtained 11/04/2021. Will follow

## 2021-12-29 DIAGNOSIS — F411 Generalized anxiety disorder: Secondary | ICD-10-CM | POA: Diagnosis not present

## 2022-01-26 DIAGNOSIS — F411 Generalized anxiety disorder: Secondary | ICD-10-CM | POA: Diagnosis not present

## 2022-02-08 DIAGNOSIS — J329 Chronic sinusitis, unspecified: Secondary | ICD-10-CM | POA: Diagnosis not present

## 2022-03-02 DIAGNOSIS — F411 Generalized anxiety disorder: Secondary | ICD-10-CM | POA: Diagnosis not present

## 2022-07-02 DIAGNOSIS — S93401A Sprain of unspecified ligament of right ankle, initial encounter: Secondary | ICD-10-CM | POA: Diagnosis not present

## 2022-08-08 ENCOUNTER — Ambulatory Visit: Payer: BC Managed Care – PPO | Admitting: Family Medicine

## 2022-08-08 VITALS — BP 128/84 | Ht 61.0 in | Wt 125.0 lb

## 2022-08-08 DIAGNOSIS — M25562 Pain in left knee: Secondary | ICD-10-CM

## 2022-08-08 DIAGNOSIS — M25462 Effusion, left knee: Secondary | ICD-10-CM | POA: Diagnosis not present

## 2022-08-08 MED ORDER — METHYLPREDNISOLONE ACETATE 40 MG/ML IJ SUSP
40.0000 mg | Freq: Once | INTRAMUSCULAR | Status: AC
Start: 2022-08-08 — End: 2022-08-08
  Administered 2022-08-08: 40 mg via INTRA_ARTICULAR

## 2022-08-08 NOTE — Progress Notes (Unsigned)
  EVVA DIN - 67 y.o. female MRN [id number]  Date of birth: 01-12-1956    CHIEF COMPLAINT:   left knee pain and swelling    SUBJECTIVE:   HPI: Kristen Sandoval is a 67 yo F with pmh left knee osteoarthritis who presents to the clinic with a 2 month history of worsening left knee pain. She reports the pain began worsening unprovoked and describes the pain as a burning sensation located at the infrapatellar portion of the knee. Activities such as walking and bending the knee seem to worsen the pain while ice and Ibuprofen have provided minimal pain relief. She also endorses radiation of the pain down the leg as well as occasional catching of the knee. She has been told in the past that she has osteoarthritis and would like to know treatment plans going forward.  ROS:     See HPI  PERTINENT  PMH / PSH FH / / SH:  Past Medical, Surgical, Social, and Family History Reviewed & Updated in the EMR.  Pertinent findings include:  Reported by patient: Operation to correct Genu Valgum (1970s), L knee operation for torn cartilage (1970s), L knee meniscus repair (early 2000s)  OBJECTIVE: BP 128/84   Ht 5\' 1"  (1.549 m)   Wt 125 lb (56.7 kg)   BMI 23.62 kg/m   Physical Exam:  Vital signs are reviewed.  GEN: Alert and oriented, NAD Pulm: Breathing unlabored PSY: normal mood, congruent affect MSK: Left Knee - 1+ effusion and warmth appreciated. Lacking final 10 degrees of extension; flexion to 80 degrees. Crepitus with range of motion. 5/5 strength with extension and flexion of the knee. Tender to palpation along medial and lateral joint lines. Negative anterior and posterior drawer.  After consent was obtained timeout was performed, patient was lying supine on exam table.  Using sterile technique the left knee was prepped with alcohol swabs and 3 ml's of 1% Lidocaine without epi used to anesthetize the needle tract into the joint from the lateral suprapatellar approach. The knee joint was entered and 22  ml's of yellow colored fluid was withdrawn. Then 3:1 lidocaine: depomedrol 40 mg was injected and the needle withdrawn.  The procedure was well tolerated.  The patient is asked to continue to rest the knee for a few more days before resuming regular activities.  It may be more painful for the first 1-2 days.  Watch for fever, or increased swelling or persistent pain in knee. Call or return to clinic prn if such symptoms occur or the knee fails to improve as anticipated.   ASSESSMENT & PLAN:  1. Left Knee Pain The most likely etiology of this patient's clinical presentation is left knee osteoarthritis. History of multiple prior knee surgeries, pain and swelling worsened with activity in addition to physical exam findings of limited ROM and crepitus are consistent with this diagnosis. Options including cortisone injection, aspiration, physical therapy, PRP/STEM cell treatment, X-Ray with referral to surgery were considered. Through joint decision making, patient has elected to proceed with aspiration and cortisone injection into the knee. It is likely that she will gain symptomatic relief from this treatment, however her symptoms will likely recur. - Knee joint was aspirated and steroid injection given into the knee as mentioned above. - Follow up as needed. If symptoms have not improved or worsen at time of follow up appointment, will consider repeating cortisone vs X-Ray and referral to surgery  Eustaquio Boyden, MS4

## 2022-08-08 NOTE — Patient Instructions (Signed)
Your pain is due to arthritis. These are the different medications you can take for this: Tylenol 500mg  1-2 tabs three times a day for pain. Capsaicin, aspercreme, or biofreeze topically up to four times a day may also help with pain. Some supplements that may help for arthritis: Boswellia extract, curcumin, pycnogenol Aleve 1-2 tabs twice a day with food Cortisone injections are an option - you were given this today. If cortisone injections do not help, there are different types of shots that may help but they take longer to take effect (gel shots). It's important that you continue to stay active. Straight leg raises, knee extensions 3 sets of 10 once a day (add ankle weight if these become too easy). Consider physical therapy to strengthen muscles around the joint that hurts to take pressure off of the joint itself. Shoe inserts with good arch support may be helpful. Heat or ice 15 minutes at a time 3-4 times a day as needed to help with pain. Water aerobics and cycling with low resistance are the best two types of exercise for arthritis though any exercise is ok as long as it doesn't worsen the pain. Follow up with me as needed.

## 2022-08-09 ENCOUNTER — Encounter: Payer: Self-pay | Admitting: Family Medicine

## 2022-09-28 DIAGNOSIS — M249 Joint derangement, unspecified: Secondary | ICD-10-CM | POA: Diagnosis not present

## 2022-09-28 DIAGNOSIS — M2012 Hallux valgus (acquired), left foot: Secondary | ICD-10-CM | POA: Diagnosis not present

## 2022-09-28 DIAGNOSIS — M19072 Primary osteoarthritis, left ankle and foot: Secondary | ICD-10-CM | POA: Diagnosis not present

## 2022-09-28 DIAGNOSIS — M79672 Pain in left foot: Secondary | ICD-10-CM | POA: Diagnosis not present

## 2022-10-13 ENCOUNTER — Encounter (INDEPENDENT_AMBULATORY_CARE_PROVIDER_SITE_OTHER): Payer: Self-pay

## 2022-10-14 ENCOUNTER — Encounter: Payer: Self-pay | Admitting: Family Medicine

## 2022-10-17 ENCOUNTER — Other Ambulatory Visit: Payer: Self-pay

## 2022-10-17 DIAGNOSIS — M25462 Effusion, left knee: Secondary | ICD-10-CM

## 2022-10-25 ENCOUNTER — Ambulatory Visit: Admission: RE | Admit: 2022-10-25 | Payer: BC Managed Care – PPO | Source: Ambulatory Visit

## 2022-10-25 DIAGNOSIS — M1712 Unilateral primary osteoarthritis, left knee: Secondary | ICD-10-CM | POA: Diagnosis not present

## 2022-10-25 DIAGNOSIS — M25462 Effusion, left knee: Secondary | ICD-10-CM

## 2022-11-02 ENCOUNTER — Other Ambulatory Visit: Payer: Self-pay

## 2022-11-02 DIAGNOSIS — M25462 Effusion, left knee: Secondary | ICD-10-CM

## 2022-11-08 DIAGNOSIS — M25562 Pain in left knee: Secondary | ICD-10-CM | POA: Diagnosis not present

## 2022-11-09 ENCOUNTER — Telehealth: Payer: Self-pay | Admitting: Family

## 2022-11-09 NOTE — Telephone Encounter (Signed)
Call patient Please schedule hospital preop for left total knee replacement

## 2022-11-10 NOTE — Telephone Encounter (Signed)
Pt has appt scheduled for 9/15

## 2022-11-11 ENCOUNTER — Encounter: Payer: Self-pay | Admitting: Family

## 2022-11-11 ENCOUNTER — Ambulatory Visit (INDEPENDENT_AMBULATORY_CARE_PROVIDER_SITE_OTHER): Payer: BC Managed Care – PPO | Admitting: Family

## 2022-11-11 VITALS — BP 128/86 | HR 86 | Temp 97.8°F | Ht 61.0 in | Wt 125.1 lb

## 2022-11-11 DIAGNOSIS — Z136 Encounter for screening for cardiovascular disorders: Secondary | ICD-10-CM

## 2022-11-11 DIAGNOSIS — F419 Anxiety disorder, unspecified: Secondary | ICD-10-CM | POA: Diagnosis not present

## 2022-11-11 DIAGNOSIS — F32A Depression, unspecified: Secondary | ICD-10-CM | POA: Diagnosis not present

## 2022-11-11 DIAGNOSIS — Z0000 Encounter for general adult medical examination without abnormal findings: Secondary | ICD-10-CM | POA: Diagnosis not present

## 2022-11-11 DIAGNOSIS — Z1231 Encounter for screening mammogram for malignant neoplasm of breast: Secondary | ICD-10-CM

## 2022-11-11 DIAGNOSIS — Z01818 Encounter for other preprocedural examination: Secondary | ICD-10-CM | POA: Diagnosis not present

## 2022-11-11 DIAGNOSIS — Z780 Asymptomatic menopausal state: Secondary | ICD-10-CM

## 2022-11-11 DIAGNOSIS — Z13220 Encounter for screening for lipoid disorders: Secondary | ICD-10-CM

## 2022-11-11 DIAGNOSIS — J4520 Mild intermittent asthma, uncomplicated: Secondary | ICD-10-CM

## 2022-11-11 LAB — LIPID PANEL
Cholesterol: 265 mg/dL — ABNORMAL HIGH (ref 0–200)
HDL: 95 mg/dL (ref >39.00)
LDL Cholesterol: 144 mg/dL — ABNORMAL HIGH (ref 0–99)
NonHDL: 169.51
Total CHOL/HDL Ratio: 3
Triglycerides: 126 mg/dL (ref 0.0–149.0)
VLDL: 25.2 mg/dL (ref 0.0–40.0)

## 2022-11-11 LAB — CBC WITH DIFFERENTIAL/PLATELET
Basophils Absolute: 0 10*3/uL (ref 0.0–0.1)
Basophils Relative: 0.7 % (ref 0.0–3.0)
Eosinophils Absolute: 0.3 10*3/uL (ref 0.0–0.7)
Eosinophils Relative: 4.7 % (ref 0.0–5.0)
HCT: 40.2 % (ref 36.0–46.0)
Hemoglobin: 13.2 g/dL (ref 12.0–15.0)
Lymphocytes Relative: 24 % (ref 12.0–46.0)
Lymphs Abs: 1.3 10*3/uL (ref 0.7–4.0)
MCHC: 32.9 g/dL (ref 30.0–36.0)
MCV: 92.9 fl (ref 78.0–100.0)
Monocytes Absolute: 0.5 10*3/uL (ref 0.1–1.0)
Monocytes Relative: 9.4 % (ref 3.0–12.0)
Neutro Abs: 3.3 10*3/uL (ref 1.4–7.7)
Neutrophils Relative %: 61.2 % (ref 43.0–77.0)
Platelets: 261 10*3/uL (ref 150.0–400.0)
RBC: 4.33 Mil/uL (ref 3.87–5.11)
RDW: 13 % (ref 11.5–15.5)
WBC: 5.3 10*3/uL (ref 4.0–10.5)

## 2022-11-11 LAB — COMPREHENSIVE METABOLIC PANEL
ALT: 18 U/L (ref 0–35)
AST: 23 U/L (ref 0–37)
Albumin: 4.1 g/dL (ref 3.5–5.2)
Alkaline Phosphatase: 90 U/L (ref 39–117)
BUN: 14 mg/dL (ref 6–23)
CO2: 28 meq/L (ref 19–32)
Calcium: 9.5 mg/dL (ref 8.4–10.5)
Chloride: 101 meq/L (ref 96–112)
Creatinine, Ser: 0.82 mg/dL (ref 0.40–1.20)
GFR: 75.3 mL/min (ref >60.00)
Glucose, Bld: 91 mg/dL (ref 70–99)
Potassium: 5.1 meq/L (ref 3.5–5.1)
Sodium: 136 meq/L (ref 135–145)
Total Bilirubin: 0.7 mg/dL (ref 0.2–1.2)
Total Protein: 7.5 g/dL (ref 6.0–8.3)

## 2022-11-11 LAB — VITAMIN D 25 HYDROXY (VIT D DEFICIENCY, FRACTURES): VITD: 28.46 ng/mL — ABNORMAL LOW (ref 30.00–100.00)

## 2022-11-11 LAB — TSH: TSH: 1.5 u[IU]/mL (ref 0.35–5.50)

## 2022-11-11 LAB — HEMOGLOBIN A1C: Hgb A1c MFr Bld: 5.7 % (ref 4.6–6.5)

## 2022-11-11 MED ORDER — FLUOXETINE HCL 20 MG PO CAPS
20.0000 mg | ORAL_CAPSULE | Freq: Every morning | ORAL | 3 refills | Status: DC
Start: 2022-11-11 — End: 2023-11-14

## 2022-11-11 NOTE — Assessment & Plan Note (Signed)
Chronic, stable.  Continue Prozac 20 mg qd

## 2022-11-11 NOTE — Patient Instructions (Addendum)
Please call  and schedule your 3D mammogram and /or bone density scan as we discussed.   [facility]  ( new location in 2023)  [address], [place], [place] [address]  [place], [place]  [phone]   Due for tetanus vaccine, Tdap.   Health Maintenance for Postmenopausal Women Menopause is a normal process in which your ability to get pregnant comes to an end. This process happens slowly over many months or years, usually between the ages of 38 and 76. Menopause is complete when you have missed your menstrual period for 12 months. It is important to talk with your health care provider about some of the most common conditions that affect women after menopause (postmenopausal women). These include heart disease, cancer, and bone loss (osteoporosis). Adopting a healthy lifestyle and getting preventive care can help to promote your health and wellness. The actions you take can also lower your chances of developing some of these common conditions. What are the signs and symptoms of menopause? During menopause, you may have the following symptoms: Hot flashes. These can be moderate or severe. Night sweats. Decrease in sex drive. Mood swings. Headaches. Tiredness (fatigue). Irritability. Memory problems. Problems falling asleep or staying asleep. Talk with your health care provider about treatment options for your symptoms. Do I need hormone replacement therapy? Hormone replacement therapy is effective in treating symptoms that are caused by menopause, such as hot flashes and night sweats. Hormone replacement carries certain risks, especially as you become older. If you are thinking about using estrogen or estrogen with progestin, discuss the benefits and risks with your health care provider. How can I reduce my risk for heart disease and stroke? The risk of heart disease, heart attack, and stroke increases as you age. One of the causes may be a change in the body's  hormones during menopause. This can affect how your body uses dietary fats, triglycerides, and cholesterol. Heart attack and stroke are medical emergencies. There are many things that you can do to help prevent heart disease and stroke. Watch your blood pressure High blood pressure causes heart disease and increases the risk of stroke. This is more likely to develop in people who have high blood pressure readings or are overweight. Have your blood pressure checked: Every 3-5 years if you are 5-83 years of age. Every year if you are 54 years old or older. Eat a healthy diet  Eat a diet that includes plenty of vegetables, fruits, low-fat dairy products, and lean protein. Do not eat a lot of foods that are high in solid fats, added sugars, or sodium. Get regular exercise Get regular exercise. This is one of the most important things you can do for your health. Most adults should: Try to exercise for at least 150 minutes each week. The exercise should increase your heart rate and make you sweat (moderate-intensity exercise). Try to do strengthening exercises at least twice each week. Do these in addition to the moderate-intensity exercise. Spend less time sitting. Even light physical activity can be beneficial. Other tips Work with your health care provider to achieve or maintain a healthy weight. Do not use any products that contain nicotine or tobacco. These products include cigarettes, chewing tobacco, and vaping devices, such as e-cigarettes. If you need help quitting, ask your health care provider. Know your numbers. Ask your health care provider to check your cholesterol and your blood sugar (glucose). Continue to have your blood tested as directed by your health care provider.  Do I need screening for cancer? Depending on your health history and family history, you may need to have cancer screenings at different stages of your life. This may include screening for: Breast cancer. Cervical  cancer. Lung cancer. Colorectal cancer. What is my risk for osteoporosis? After menopause, you may be at increased risk for osteoporosis. Osteoporosis is a condition in which bone destruction happens more quickly than new bone creation. To help prevent osteoporosis or the bone fractures that can happen because of osteoporosis, you may take the following actions: If you are 24-34 years old, get at least 1,000 mg of calcium and at least 600 international units (IU) of vitamin D per day. If you are older than age 67 but younger than age 73, get at least 1,200 mg of calcium and at least 600 international units (IU) of vitamin D per day. If you are older than age 37, get at least 1,200 mg of calcium and at least 800 international units (IU) of vitamin D per day. Smoking and drinking excessive alcohol increase the risk of osteoporosis. Eat foods that are rich in calcium and vitamin D, and do weight-bearing exercises several times each week as directed by your health care provider. How does menopause affect my mental health? Depression may occur at any age, but it is more common as you become older. Common symptoms of depression include: Feeling depressed. Changes in sleep patterns. Changes in appetite or eating patterns. Feeling an overall lack of motivation or enjoyment of activities that you previously enjoyed. Frequent crying spells. Talk with your health care provider if you think that you are experiencing any of these symptoms. General instructions See your health care provider for regular wellness exams and vaccines. This may include: Scheduling regular health, dental, and eye exams. Getting and maintaining your vaccines. These include: Influenza vaccine. Get this vaccine each year before the flu season begins. Pneumonia vaccine. Shingles vaccine. Tetanus, diphtheria, and pertussis (Tdap) booster vaccine. Your health care provider may also recommend other immunizations. Tell your health  care provider if you have ever been abused or do not feel safe at home. Summary Menopause is a normal process in which your ability to get pregnant comes to an end. This condition causes hot flashes, night sweats, decreased interest in sex, mood swings, headaches, or lack of sleep. Treatment for this condition may include hormone replacement therapy. Take actions to keep yourself healthy, including exercising regularly, eating a healthy diet, watching your weight, and checking your blood pressure and blood sugar levels. Get screened for cancer and depression. Make sure that you are up to date with all your vaccines. This information is not intended to replace advice given to you by your health care provider. Make sure you discuss any questions you have with your health care provider. Document Revised: 07/06/2020 Document Reviewed: 07/06/2020 Elsevier Patient Education  2024 [employer].

## 2022-11-11 NOTE — Assessment & Plan Note (Signed)
Clinical breast exam performed today.  Deferred pelvic exam in the absence of complaints.  Patient is a history of hysterectomy and no cervix.  Patient will schedule mammogram and bone density

## 2022-11-11 NOTE — Progress Notes (Unsigned)
Assessment & Plan:  Routine physical examination Assessment & Plan: Clinical breast exam performed today.  Deferred pelvic exam in the absence of complaints.  Patient is a history of hysterectomy and no cervix.  Patient will schedule mammogram and bone density  Orders: -     VITAMIN D 25 Hydroxy (Vit-D Deficiency, Fractures) -     Hemoglobin A1c -     CBC with Differential/Platelet -     Comprehensive metabolic panel -     Lipid panel -     TSH -     3D Screening Mammogram, Left and Right; Future -     FLUoxetine HCl; Take 1 capsule (20 mg total) by mouth every morning.  Dispense: 90 capsule; Refill: 3 -     DG Bone Density; Future  Mild intermittent asthma without complication  Anxiety and depression Assessment & Plan: Chronic, stable.  Continue Prozac 20 mg qd  Orders: -     FLUoxetine HCl; Take 1 capsule (20 mg total) by mouth every morning.  Dispense: 90 capsule; Refill: 3  Encounter for screening mammogram for malignant neoplasm of breast Assessment & Plan: Fortunately patient has does not have a strong cardiac history.  Denies cardiac symptoms at this time.  Baseline EKG shows sinus rhythm.  When compared to previous EKG obtained 09/04/2020, no significant changes. EKG reviewed with supervising, Dr Duncan Dull, and she and I jointly agreed on management plan.    Orders: -     3D Screening Mammogram, Left and Right; Future  Encounter for lipid screening for cardiovascular disease -     Lipid panel  Asymptomatic postmenopausal state -     DG Bone Density; Future  Encounter for preoperative assessment Assessment & Plan: Fortunately patient has does not have a strong cardiac history.  Denies cardiac symptoms at this time.  Baseline EKG shows sinus rhythm.  When compared to previous EKG obtained 09/04/2020, no significant changes. EKG reviewed with supervising, Dr Duncan Dull, and she and I jointly agreed on management plan.    Orders: -     Hemoglobin A1c -      CBC with Differential/Platelet -     Comprehensive metabolic panel -     EKG 12-Lead     Return precautions given.   Risks, benefits, and alternatives of the medications and treatment plan prescribed today were discussed, and patient expressed understanding.   Education regarding symptom management and diagnosis given to patient on AVS either electronically or printed.  Return for Complete Physical Exam.  Rennie Plowman, FNP  Subjective:    Patient ID: Kristen Sandoval, female    DOB: 05-28-1955, 67 y.o.   MRN: [id number]  CC: Kristen Sandoval is a 67 y.o. female who presents today for physical exam.    HPI: Feels well today.  No new complaints.  Continues to have left knee pain and swelling.   She is scheduled for left knee total arthroplasty Nolon Stalls in November, Dr Roma Kayser  She has no history of hypertension, coronary artery disease.  She exercises regularly without chest pain, shortness of breath or dizziness  She feels well on Prozac and feels dose is adequate  Colorectal Cancer Screening: UTD , Dr Meridee Score, repeat in 3 years Breast Cancer Screening: Mammogram due next month Cervical Cancer Screening: History of hysterectomy for noncancerous reasons, ovarian cyst and menorrhagia; She doesn't have a cervix. Exam 10/18/2016  Bone Health screening/DEXA for 65+: No increased fracture risk. Defer screening at this time.  Lung  Cancer Screening: Doesn't have 20 year pack year history and age > 46 years yo 54 years  AAA screen for all men and women aged 46 to 56 with h/o tobacco, men 20 years older with family h/o AAA and women 65 years or older who have ever smoked or have family history of AAA.        Tetanus - due        Exercise: Gets regular exercise, limited by left knee pain.  She continues to go to exercise classes . she focused on upper body strength.  Alcohol use:  occassional Smoking/tobacco use: former smoker.    Health Maintenance  Topic Date Due    DTaP/Tdap/Td vaccine (2 - Td or Tdap) 04/16/2022   COVID-19 Vaccine (5 - 2023-24 season) 10/30/2022   Colon Cancer Screening  10/14/2023   Mammogram  12/07/2023   Flu Shot  Completed   Hepatitis C Screening  Completed   HIV Screening  Completed   Zoster (Shingles) Vaccine  Completed   HPV Vaccine  Aged Out    ALLERGIES: Biaxin [clarithromycin], Erythromycin, and Latex  Current Outpatient Medications on File Prior to Visit  Medication Sig Dispense Refill   albuterol (VENTOLIN HFA) 108 (90 Base) MCG/ACT inhaler INHALE 2 PUFFS INTO LUNGS EVERY 6 HOURS AS NEEDED FOR WHEEZING OR SHORTNESS OF BREATH 18 g 3   Multiple Vitamin (MULTIVITAMIN) tablet Take 1 tablet by mouth daily.     No current facility-administered medications on file prior to visit.    Review of Systems  Constitutional:  Negative for chills and fever.  Respiratory:  Negative for cough.   Cardiovascular:  Negative for chest pain and palpitations.  Gastrointestinal:  Negative for nausea and vomiting.      Objective:    BP 128/86   Pulse 86   Temp 97.8 F (36.6 C) (Oral)   Ht 5\' 1"  (1.549 m)   Wt 125 lb 1.6 oz (56.7 kg)   SpO2 99%   BMI 23.64 kg/m   BP Readings from Last 3 Encounters:  11/11/22 128/86  08/08/22 128/84  11/03/21 118/64   Wt Readings from Last 3 Encounters:  11/11/22 125 lb 1.6 oz (56.7 kg)  08/08/22 125 lb (56.7 kg)  11/03/21 123 lb 12.8 oz (56.2 kg)      11/11/2022    9:43 AM 11/03/2021    3:42 PM 09/04/2020    9:20 AM  Depression screen PHQ 2/9  Decreased Interest 0 0 0  Down, Depressed, Hopeless 0 0 0  PHQ - 2 Score 0 0 0  Altered sleeping 1    Tired, decreased energy 1    Change in appetite 0    Feeling bad or failure about yourself  0    Trouble concentrating 0    Moving slowly or fidgety/restless 0    Suicidal thoughts 0    PHQ-9 Score 2    Difficult doing work/chores Not difficult at all      Physical Exam Vitals reviewed.  Constitutional:      Appearance: Normal  appearance. She is well-developed.  Eyes:     Conjunctiva/sclera: Conjunctivae normal.  Neck:     Thyroid: No thyroid mass or thyromegaly.  Cardiovascular:     Rate and Rhythm: Normal rate and regular rhythm.     Pulses: Normal pulses.     Heart sounds: Normal heart sounds.  Pulmonary:     Effort: Pulmonary effort is normal.     Breath sounds: Normal breath sounds. No wheezing,  rhonchi or rales.  Chest:  Breasts:    Breasts are symmetrical.     Right: No inverted nipple, mass, nipple discharge, skin change or tenderness.     Left: No inverted nipple, mass, nipple discharge, skin change or tenderness.  Abdominal:     General: Bowel sounds are normal. There is no distension.     Palpations: Abdomen is soft. Abdomen is not rigid. There is no fluid wave or mass.     Tenderness: There is no abdominal tenderness. There is no guarding or rebound.  Lymphadenopathy:     Head:     Right side of head: No submental, submandibular, tonsillar, preauricular, posterior auricular or occipital adenopathy.     Left side of head: No submental, submandibular, tonsillar, preauricular, posterior auricular or occipital adenopathy.     Cervical: No cervical adenopathy.     Right cervical: No superficial, deep or posterior cervical adenopathy.    Left cervical: No superficial, deep or posterior cervical adenopathy.  Skin:    General: Skin is warm and dry.  Neurological:     Mental Status: She is alert.  Psychiatric:        Speech: Speech normal.        Behavior: Behavior normal.        Thought Content: Thought content normal.

## 2022-11-11 NOTE — Assessment & Plan Note (Signed)
Fortunately patient has does not have a strong cardiac history.  Denies cardiac symptoms at this time.  Baseline EKG shows sinus rhythm.  When compared to previous EKG obtained 09/04/2020, no significant changes. EKG reviewed with supervising, Dr Duncan Dull, and she and I jointly agreed on management plan.

## 2022-11-16 ENCOUNTER — Encounter: Payer: Self-pay | Admitting: Family

## 2022-11-16 NOTE — Telephone Encounter (Signed)
Extracted into chart

## 2022-11-17 ENCOUNTER — Encounter: Payer: Self-pay | Admitting: Family

## 2022-11-18 ENCOUNTER — Telehealth: Payer: Self-pay

## 2022-11-18 NOTE — Telephone Encounter (Signed)
Surgery clearance placed in folder for provider signature. Chart notes have been printed and attached

## 2022-11-22 NOTE — Telephone Encounter (Signed)
Surgery clearance signed  Please fax with labs

## 2022-11-22 NOTE — Telephone Encounter (Signed)
Faxed on 11/22/22

## 2023-01-10 DIAGNOSIS — M9904 Segmental and somatic dysfunction of sacral region: Secondary | ICD-10-CM | POA: Diagnosis not present

## 2023-01-10 DIAGNOSIS — M9905 Segmental and somatic dysfunction of pelvic region: Secondary | ICD-10-CM | POA: Diagnosis not present

## 2023-01-10 DIAGNOSIS — M9903 Segmental and somatic dysfunction of lumbar region: Secondary | ICD-10-CM | POA: Diagnosis not present

## 2023-01-10 DIAGNOSIS — M5417 Radiculopathy, lumbosacral region: Secondary | ICD-10-CM | POA: Diagnosis not present

## 2023-01-11 DIAGNOSIS — M9904 Segmental and somatic dysfunction of sacral region: Secondary | ICD-10-CM | POA: Diagnosis not present

## 2023-01-11 DIAGNOSIS — M9903 Segmental and somatic dysfunction of lumbar region: Secondary | ICD-10-CM | POA: Diagnosis not present

## 2023-01-11 DIAGNOSIS — M9905 Segmental and somatic dysfunction of pelvic region: Secondary | ICD-10-CM | POA: Diagnosis not present

## 2023-01-11 DIAGNOSIS — M5417 Radiculopathy, lumbosacral region: Secondary | ICD-10-CM | POA: Diagnosis not present

## 2023-01-17 DIAGNOSIS — M9905 Segmental and somatic dysfunction of pelvic region: Secondary | ICD-10-CM | POA: Diagnosis not present

## 2023-01-17 DIAGNOSIS — M5417 Radiculopathy, lumbosacral region: Secondary | ICD-10-CM | POA: Diagnosis not present

## 2023-01-17 DIAGNOSIS — M9904 Segmental and somatic dysfunction of sacral region: Secondary | ICD-10-CM | POA: Diagnosis not present

## 2023-01-17 DIAGNOSIS — M9903 Segmental and somatic dysfunction of lumbar region: Secondary | ICD-10-CM | POA: Diagnosis not present

## 2023-01-29 HISTORY — PX: JOINT REPLACEMENT: SHX530

## 2023-02-01 ENCOUNTER — Ambulatory Visit
Admission: RE | Admit: 2023-02-01 | Discharge: 2023-02-01 | Disposition: A | Discharge status: Home / Self Care | Payer: BC Managed Care – PPO | Source: Ambulatory Visit | Attending: Family | Admitting: Family

## 2023-02-01 DIAGNOSIS — Z0000 Encounter for general adult medical examination without abnormal findings: Secondary | ICD-10-CM | POA: Diagnosis present

## 2023-02-01 DIAGNOSIS — Z780 Asymptomatic menopausal state: Secondary | ICD-10-CM | POA: Insufficient documentation

## 2023-02-01 DIAGNOSIS — Z1231 Encounter for screening mammogram for malignant neoplasm of breast: Secondary | ICD-10-CM

## 2023-06-01 ENCOUNTER — Encounter: Payer: Self-pay | Admitting: Family

## 2023-08-31 ENCOUNTER — Other Ambulatory Visit: Payer: Self-pay | Admitting: Podiatry

## 2023-08-31 ENCOUNTER — Other Ambulatory Visit: Payer: Self-pay

## 2023-08-31 ENCOUNTER — Encounter
Admission: RE | Admit: 2023-08-31 | Discharge: 2023-08-31 | Disposition: A | Discharge status: Home / Self Care | Source: Ambulatory Visit | Attending: Podiatry | Admitting: Podiatry

## 2023-08-31 DIAGNOSIS — J4520 Mild intermittent asthma, uncomplicated: Secondary | ICD-10-CM

## 2023-08-31 DIAGNOSIS — R030 Elevated blood-pressure reading, without diagnosis of hypertension: Secondary | ICD-10-CM

## 2023-08-31 DIAGNOSIS — Z01818 Encounter for other preprocedural examination: Secondary | ICD-10-CM | POA: Insufficient documentation

## 2023-08-31 DIAGNOSIS — E7800 Pure hypercholesterolemia, unspecified: Secondary | ICD-10-CM

## 2023-08-31 DIAGNOSIS — M7130 Other bursal cyst, unspecified site: Secondary | ICD-10-CM

## 2023-08-31 DIAGNOSIS — D649 Anemia, unspecified: Secondary | ICD-10-CM

## 2023-08-31 DIAGNOSIS — Z01810 Encounter for preprocedural cardiovascular examination: Secondary | ICD-10-CM | POA: Diagnosis not present

## 2023-08-31 HISTORY — DX: Hallux valgus (acquired), left foot: M20.12

## 2023-08-31 HISTORY — DX: Anemia, unspecified: D64.9

## 2023-08-31 HISTORY — DX: Prediabetes: R73.03

## 2023-08-31 HISTORY — DX: Pneumonia, unspecified organism: J18.9

## 2023-08-31 HISTORY — DX: Bunion of left foot: M21.612

## 2023-08-31 LAB — BASIC METABOLIC PANEL WITH GFR
Anion gap: 11 (ref 5–15)
BUN: 22 mg/dL (ref 8–23)
CO2: 23 mmol/L (ref 22–32)
Calcium: 9.1 mg/dL (ref 8.9–10.3)
Chloride: 106 mmol/L (ref 98–111)
Creatinine, Ser: 0.9 mg/dL (ref 0.44–1.00)
GFR, Estimated: >60 mL/min (ref >60)
Glucose, Bld: 113 mg/dL — ABNORMAL HIGH (ref 70–99)
Potassium: 3.8 mmol/L (ref 3.5–5.1)
Sodium: 140 mmol/L (ref 135–145)

## 2023-08-31 LAB — CBC
HCT: 35.7 % — ABNORMAL LOW (ref 36.0–46.0)
Hemoglobin: 12 g/dL (ref 12.0–15.0)
MCH: 30.2 pg (ref 26.0–34.0)
MCHC: 33.6 g/dL (ref 30.0–36.0)
MCV: 89.7 fL (ref 80.0–100.0)
Platelets: 244 10*3/uL (ref 150–400)
RBC: 3.98 MIL/uL (ref 3.87–5.11)
RDW: 12.6 % (ref 11.5–15.5)
WBC: 4.7 10*3/uL (ref 4.0–10.5)
nRBC: 0 % (ref 0.0–0.2)

## 2023-08-31 NOTE — Patient Instructions (Addendum)
 Your procedure is scheduled on: 09/08/23 - Friday Report to the Registration Desk on the 1st floor of the [facility]. To find out your arrival time, please call [phone] between 1PM - 3PM on: 09/07/23 - Thursday If your arrival time is 6:00 am, do not arrive before that time as the [facility] entrance doors do not open until 6:00 am.  REMEMBER: Instructions that are not followed completely may result in serious medical risk, up to and including death; or upon the discretion of your surgeon and anesthesiologist your surgery may need to be rescheduled.  Do not eat food after midnight the night before surgery.  No gum chewing or hard candies.  You may however, drink CLEAR liquids up to 2 hours before you are scheduled to arrive for your surgery. Do not drink anything within 2 hours of your scheduled arrival time.  Clear liquids include: - water  - apple juice without pulp - gatorade (not RED colors) - black coffee or tea (Do NOT add milk or creamers to the coffee or tea) Do NOT drink anything that is not on this list.  One week prior to surgery: Stop Anti-inflammatories (NSAIDS) such as Advil, Aleve, Ibuprofen, Motrin, Naproxen, Naprosyn and Aspirin based products such as Excedrin, Goody's Powder, BC Powder. You may take Tylenol if needed for pain up until the day of surgery.  Stop ANY OVER THE COUNTER supplements until after surgery : Vitamin K, Vitamin D3  ON THE DAY OF SURGERY ONLY TAKE THESE MEDICATIONS WITH SIPS OF WATER:  none  No Alcohol for 24 hours before or after surgery.  No Smoking including e-cigarettes for 24 hours before surgery.  No chewable tobacco products for at least 6 hours before surgery.  No nicotine patches on the day of surgery.  Do not use any recreational drugs for at least a week (preferably 2 weeks) before your surgery.  Please be advised that the combination of cocaine and anesthesia may have negative outcomes, up to and including death. If  you test positive for cocaine, your surgery will be cancelled.  On the morning of surgery brush your teeth with toothpaste and water, you may rinse your mouth with mouthwash if you wish. Do not swallow any toothpaste or mouthwash.  Use CHG Soap or wipes as directed on instruction sheet.  Do not wear jewelry, make-up, hairpins, clips or nail polish.  For welded (permanent) jewelry: bracelets, anklets, waist bands, etc.  Please have this removed prior to surgery.  If it is not removed, there is a chance that hospital personnel will need to cut it off on the day of surgery.  Do not wear lotions, powders, or perfumes.   Do not shave body hair from the neck down 48 hours before surgery.  Contact lenses, hearing aids and dentures may not be worn into surgery.  Do not bring valuables to the hospital. [facility] is not responsible for any missing/lost belongings or valuables.   Notify your doctor if there is any change in your medical condition (cold, fever, infection).  Wear comfortable clothing (specific to your surgery type) to the hospital.  After surgery, you can help prevent lung complications by doing breathing exercises.  Take deep breaths and cough every 1-2 hours. Your doctor may order a device called an Incentive Spirometer to help you take deep breaths.  When coughing or sneezing, hold a pillow firmly against your incision with both hands. This is called "splinting." Doing this helps protect your incision. It also decreases  belly discomfort.  If you are being admitted to the hospital overnight, leave your suitcase in the car. After surgery it may be brought to your room.  In case of increased patient census, it may be necessary for you, the patient, to continue your postoperative care in the Same Day Surgery department.  If you are being discharged the day of surgery, you will not be allowed to drive home. You will need a responsible individual to drive you home and stay with  you for 24 hours after surgery.   If you are taking public transportation, you will need to have a responsible individual with you.  Please call the Pre-admissions Testing Dept. at [phone] if you have any questions about these instructions.  Surgery Visitation Policy:  Patients having surgery or a procedure may have two visitors.  Children under the age of 69 must have an adult with them who is not the patient.  Inpatient Visitation:    Visiting hours are 7 a.m. to 8 p.m. Up to four visitors are allowed at one time in a patient room. The visitors may rotate out with other people during the day.  One visitor age 92 or older may stay with the patient overnight and must be in the room by 8 p.m.   Merchandiser, retail to address health-related social needs:  [email or web address]     Preparing for Surgery with CHLORHEXIDINE GLUCONATE (CHG) Soap  Chlorhexidine Gluconate (CHG) Soap  o An antiseptic cleaner that kills germs and bonds with the skin to continue killing germs even after washing  o Used for showering the night before surgery and morning of surgery  Before surgery, you can play an important role by reducing the number of germs on your skin.  CHG (Chlorhexidine gluconate) soap is an antiseptic cleanser which kills germs and bonds with the skin to continue killing germs even after washing.  Please do not use if you have an allergy to CHG or antibacterial soaps. If your skin becomes reddened/irritated stop using the CHG.  1. Shower the NIGHT BEFORE SURGERY and the MORNING OF SURGERY with CHG soap.  2. If you choose to wash your hair, wash your hair first as usual with your normal shampoo.  3. After shampooing, rinse your hair and body thoroughly to remove the shampoo.  4. Use CHG as you would any other liquid soap. You can apply CHG directly to the skin and wash gently with a scrungie or a clean washcloth.  5. Apply the CHG soap to your body  only from the neck down. Do not use on open wounds or open sores. Avoid contact with your eyes, ears, mouth, and genitals (private parts). Wash face and genitals (private parts) with your normal soap.  6. Wash thoroughly, paying special attention to the area where your surgery will be performed.  7. Thoroughly rinse your body with warm water.  8. Do not shower/wash with your normal soap after using and rinsing off the CHG soap.  9. Pat yourself dry with a clean towel.  10. Wear clean pajamas to bed the night before surgery.  12. Place clean sheets on your bed the night of your first shower and do not sleep with pets.  13. Shower again with the CHG soap on the day of surgery prior to arriving at the hospital.  14. Do not apply any deodorants/lotions/powders.  15. Please wear clean clothes to the hospital.

## 2023-09-08 ENCOUNTER — Ambulatory Visit

## 2023-09-08 ENCOUNTER — Ambulatory Visit: Admitting: Anesthesiology

## 2023-09-08 ENCOUNTER — Ambulatory Visit: Payer: Self-pay | Admitting: Urgent Care

## 2023-09-08 ENCOUNTER — Encounter
Admission: RE | Disposition: A | Discharge status: Home / Self Care | Payer: Self-pay | Source: Ambulatory Visit | Attending: Podiatry

## 2023-09-08 ENCOUNTER — Other Ambulatory Visit: Payer: Self-pay

## 2023-09-08 ENCOUNTER — Ambulatory Visit
Admission: RE | Admit: 2023-09-08 | Discharge: 2023-09-08 | Disposition: A | Discharge status: Home / Self Care | Source: Ambulatory Visit | Attending: Podiatry | Admitting: Podiatry

## 2023-09-08 ENCOUNTER — Encounter: Payer: Self-pay | Admitting: Podiatry

## 2023-09-08 DIAGNOSIS — K219 Gastro-esophageal reflux disease without esophagitis: Secondary | ICD-10-CM | POA: Insufficient documentation

## 2023-09-08 DIAGNOSIS — M2012 Hallux valgus (acquired), left foot: Secondary | ICD-10-CM | POA: Insufficient documentation

## 2023-09-08 DIAGNOSIS — F419 Anxiety disorder, unspecified: Secondary | ICD-10-CM | POA: Insufficient documentation

## 2023-09-08 DIAGNOSIS — M2042 Other hammer toe(s) (acquired), left foot: Secondary | ICD-10-CM | POA: Insufficient documentation

## 2023-09-08 DIAGNOSIS — E559 Vitamin D deficiency, unspecified: Secondary | ICD-10-CM | POA: Insufficient documentation

## 2023-09-08 DIAGNOSIS — M19072 Primary osteoarthritis, left ankle and foot: Secondary | ICD-10-CM | POA: Diagnosis not present

## 2023-09-08 DIAGNOSIS — Z79899 Other long term (current) drug therapy: Secondary | ICD-10-CM | POA: Insufficient documentation

## 2023-09-08 DIAGNOSIS — J45909 Unspecified asthma, uncomplicated: Secondary | ICD-10-CM | POA: Diagnosis not present

## 2023-09-08 DIAGNOSIS — F32A Depression, unspecified: Secondary | ICD-10-CM | POA: Insufficient documentation

## 2023-09-08 DIAGNOSIS — Z87891 Personal history of nicotine dependence: Secondary | ICD-10-CM | POA: Diagnosis not present

## 2023-09-08 HISTORY — PX: HAMMER TOE SURGERY: SHX385

## 2023-09-08 HISTORY — PX: HALLUX VALGUS LAPIDUS: SHX6626

## 2023-09-08 HISTORY — PX: ARTHRODESIS METATARSAL: SHX6565

## 2023-09-08 HISTORY — PX: TARSAL TUNNEL RELEASE: SHX5042

## 2023-09-08 SURGERY — FUSION, JOINT, INVOLVING METATARSAL BONE
Anesthesia: General | Site: Toe | Laterality: Left

## 2023-09-08 MED ORDER — LACTATED RINGERS IV SOLN: INTRAVENOUS | Status: DC

## 2023-09-08 MED ORDER — ASPIRIN 81 MG PO TBEC: 81.0000 mg | DELAYED_RELEASE_TABLET | Freq: Two times a day (BID) | ORAL | 12 refills | Status: DC

## 2023-09-08 MED ORDER — PHENYLEPHRINE 80 MCG/ML (10ML) SYRINGE FOR IV PUSH (FOR BLOOD PRESSURE SUPPORT)
PREFILLED_SYRINGE | INTRAVENOUS | Status: AC
  Filled 2023-09-08: qty 10

## 2023-09-08 MED ORDER — EPHEDRINE SULFATE-NACL 50-0.9 MG/10ML-% IV SOSY
PREFILLED_SYRINGE | INTRAVENOUS | Status: DC | PRN
  Administered 2023-09-08: 5 mg via INTRAVENOUS

## 2023-09-08 MED ORDER — KETOROLAC TROMETHAMINE 30 MG/ML IJ SOLN
INTRAMUSCULAR | Status: DC | PRN
  Administered 2023-09-08: 15 mg via INTRAVENOUS

## 2023-09-08 MED ORDER — CHLORHEXIDINE GLUCONATE 0.12 % MT SOLN
15.0000 mL | Freq: Once | OROMUCOSAL | Status: AC
  Administered 2023-09-08: 15 mL via OROMUCOSAL

## 2023-09-08 MED ORDER — LACTATED RINGERS IV SOLN: INTRAVENOUS | Status: DC | PRN

## 2023-09-08 MED ORDER — BUPIVACAINE LIPOSOME 1.3 % IJ SUSP
INTRAMUSCULAR | Status: AC
  Filled 2023-09-08: qty 20

## 2023-09-08 MED ORDER — LIDOCAINE HCL (PF) 1 % IJ SOLN
INTRAMUSCULAR | Status: AC
  Filled 2023-09-08: qty 30

## 2023-09-08 MED ORDER — LIDOCAINE HCL (PF) 2 % IJ SOLN
INTRAMUSCULAR | Status: AC
  Filled 2023-09-08: qty 5

## 2023-09-08 MED ORDER — ACETAMINOPHEN 10 MG/ML IV SOLN
INTRAVENOUS | Status: DC | PRN
  Administered 2023-09-08: 1000 mg via INTRAVENOUS

## 2023-09-08 MED ORDER — ACETAMINOPHEN 10 MG/ML IV SOLN
INTRAVENOUS | Status: AC
  Filled 2023-09-08: qty 100

## 2023-09-08 MED ORDER — KETOROLAC TROMETHAMINE 30 MG/ML IJ SOLN
INTRAMUSCULAR | Status: AC
  Filled 2023-09-08: qty 1

## 2023-09-08 MED ORDER — BUPIVACAINE HCL (PF) 0.5 % IJ SOLN
INTRAMUSCULAR | Status: AC
  Filled 2023-09-08: qty 30

## 2023-09-08 MED ORDER — LIDOCAINE HCL (PF) 1 % IJ SOLN
INTRAMUSCULAR | Status: DC | PRN
  Administered 2023-09-08: 3 mL

## 2023-09-08 MED ORDER — LIDOCAINE HCL (PF) 1 % IJ SOLN
INTRAMUSCULAR | Status: AC
  Filled 2023-09-08: qty 5

## 2023-09-08 MED ORDER — BUPIVACAINE LIPOSOME 1.3 % IJ SUSP
INTRAMUSCULAR | Status: DC | PRN
  Administered 2023-09-08: 20 mL

## 2023-09-08 MED ORDER — PROPOFOL 10 MG/ML IV BOLUS
INTRAVENOUS | Status: DC | PRN
  Administered 2023-09-08: 110 mg via INTRAVENOUS
  Administered 2023-09-08: 60 mg via INTRAVENOUS

## 2023-09-08 MED ORDER — BUPIVACAINE HCL (PF) 0.5 % IJ SOLN
INTRAMUSCULAR | Status: DC | PRN
  Administered 2023-09-08: 10 mL

## 2023-09-08 MED ORDER — CHLORHEXIDINE GLUCONATE 0.12 % MT SOLN
OROMUCOSAL | Status: AC
  Filled 2023-09-08: qty 15

## 2023-09-08 MED ORDER — BUPIVACAINE LIPOSOME 1.3 % IJ SUSP
INTRAMUSCULAR | Status: AC
Start: 2023-09-08 — End: 2023-09-08
  Filled 2023-09-08: qty 20

## 2023-09-08 MED ORDER — CEFAZOLIN SODIUM-DEXTROSE 2-4 GM/100ML-% IV SOLN
INTRAVENOUS | Status: AC
  Filled 2023-09-08: qty 100

## 2023-09-08 MED ORDER — DEXAMETHASONE SODIUM PHOSPHATE 10 MG/ML IJ SOLN
INTRAMUSCULAR | Status: AC
  Filled 2023-09-08: qty 1

## 2023-09-08 MED ORDER — MIDAZOLAM HCL 2 MG/2ML IJ SOLN
INTRAMUSCULAR | Status: AC
  Filled 2023-09-08: qty 2

## 2023-09-08 MED ORDER — CEFAZOLIN SODIUM-DEXTROSE 2-4 GM/100ML-% IV SOLN
2.0000 g | INTRAVENOUS | Status: AC
Start: 2023-09-08 — End: 2023-09-08
  Administered 2023-09-08: 2 g via INTRAVENOUS

## 2023-09-08 MED ORDER — 0.9 % SODIUM CHLORIDE (POUR BTL) OPTIME
TOPICAL | Status: DC | PRN
  Administered 2023-09-08 (×2): 500 mL

## 2023-09-08 MED ORDER — DEXAMETHASONE SODIUM PHOSPHATE 10 MG/ML IJ SOLN
INTRAMUSCULAR | Status: DC | PRN
  Administered 2023-09-08: 10 mg via INTRAVENOUS

## 2023-09-08 MED ORDER — ORAL CARE MOUTH RINSE: 15.0000 mL | Freq: Once | OROMUCOSAL | Status: AC

## 2023-09-08 MED ORDER — PHENYLEPHRINE 80 MCG/ML (10ML) SYRINGE FOR IV PUSH (FOR BLOOD PRESSURE SUPPORT)
PREFILLED_SYRINGE | INTRAVENOUS | Status: DC | PRN
  Administered 2023-09-08: 160 ug via INTRAVENOUS
  Administered 2023-09-08: 80 ug via INTRAVENOUS
  Administered 2023-09-08: 160 ug via INTRAVENOUS
  Administered 2023-09-08: 80 ug via INTRAVENOUS
  Administered 2023-09-08 (×2): 160 ug via INTRAVENOUS

## 2023-09-08 MED ORDER — ONDANSETRON HCL 4 MG/2ML IJ SOLN
INTRAMUSCULAR | Status: DC | PRN
  Administered 2023-09-08: 4 mg via INTRAVENOUS

## 2023-09-08 MED ORDER — EPHEDRINE 5 MG/ML INJ
INTRAVENOUS | Status: AC
  Filled 2023-09-08: qty 5

## 2023-09-08 MED ORDER — PROPOFOL 10 MG/ML IV BOLUS
INTRAVENOUS | Status: AC
Start: 2023-09-08 — End: 2023-09-08
  Filled 2023-09-08: qty 20

## 2023-09-08 MED ORDER — ONDANSETRON HCL 4 MG/2ML IJ SOLN
INTRAMUSCULAR | Status: AC
  Filled 2023-09-08: qty 2

## 2023-09-08 MED ORDER — MIDAZOLAM HCL 2 MG/2ML IJ SOLN
1.0000 mg | INTRAMUSCULAR | Status: AC | PRN
  Administered 2023-09-08 (×2): 1 mg via INTRAVENOUS

## 2023-09-08 MED ORDER — OXYCODONE-ACETAMINOPHEN 5-325 MG PO TABS: 1.0000 | ORAL_TABLET | Freq: Four times a day (QID) | ORAL | 0 refills | Status: DC | PRN

## 2023-09-08 SURGICAL SUPPLY — 70 items
BENZOIN TINCTURE PRP APPL 2/3 (GAUZE/BANDAGES/DRESSINGS) ×3 IMPLANT
BLADE MED AGGRESSIVE (BLADE) ×3 IMPLANT
BLADE OSC/SAGITTAL 5.5X25 (BLADE) ×3 IMPLANT
BLADE OSC/SAGITTAL MD 5.5X18 (BLADE) IMPLANT
BLADE SAW LAPIPLASTY 40X11 (BLADE) IMPLANT
BLADE SURG 15 STRL LF DISP TIS (BLADE) ×6 IMPLANT
BLADE SURG MINI STRL (BLADE) ×3 IMPLANT
BNDG COHESIVE 4X5 TAN STRL LF (GAUZE/BANDAGES/DRESSINGS) ×3 IMPLANT
BNDG ELASTIC 4X5.8 VLCR NS LF (GAUZE/BANDAGES/DRESSINGS) ×6 IMPLANT
BNDG ESMARCH 4X12 STRL LF (GAUZE/BANDAGES/DRESSINGS) ×3 IMPLANT
BNDG GAUZE DERMACEA FLUFF 4 (GAUZE/BANDAGES/DRESSINGS) ×3 IMPLANT
BNDG STRETCH 4X75 STRL LF (GAUZE/BANDAGES/DRESSINGS) ×3 IMPLANT
BNDG STRETCH GAUZE 3IN X12FT (GAUZE/BANDAGES/DRESSINGS) ×3 IMPLANT
BOOT STEPPER DURA SM (SOFTGOODS) IMPLANT
BUR 4X45 EGG (BURR) ×3 IMPLANT
COVER LIGHT HANDLE STERIS (MISCELLANEOUS) ×6 IMPLANT
COVER PIN YLW 0.028-062 (MISCELLANEOUS) ×3 IMPLANT
CUFF TOURN SGL QUICK 12 (TOURNIQUET CUFF) IMPLANT
CUFF TOURN SGL QUICK 18X4 (TOURNIQUET CUFF) ×3 IMPLANT
DRAPE FLUOR MINI C-ARM 54X84 (DRAPES) ×3 IMPLANT
DURAPREP 26ML APPLICATOR (WOUND CARE) ×3 IMPLANT
ELECTRODE REM PT RTRN 9FT ADLT (ELECTROSURGICAL) ×3 IMPLANT
GAUZE SPONGE 4X4 12PLY STRL (GAUZE/BANDAGES/DRESSINGS) ×3 IMPLANT
GAUZE STRETCH 2X75IN STRL (MISCELLANEOUS) ×3 IMPLANT
GAUZE XEROFORM 1X8 LF (GAUZE/BANDAGES/DRESSINGS) ×3 IMPLANT
GLOVE BIO SURGEON STRL SZ7.5 (GLOVE) ×6 IMPLANT
GLOVE BIOGEL PI IND STRL 8 (GLOVE) ×3 IMPLANT
GLOVE INDICATOR 8.0 STRL GRN (GLOVE) ×3 IMPLANT
GOWN STRL REUS W/ TWL XL LVL3 (GOWN DISPOSABLE) ×6 IMPLANT
GUIDEWIRE 1.1 DT 2-ZONE (WIRE) IMPLANT
IMPL LAPIPLASTY LESSER TMT FIX (Orthopedic Implant) IMPLANT
KIT TURNOVER KIT A (KITS) ×3 IMPLANT
KWIRE DBL END TROCAR 6X.045 (WIRE) IMPLANT
KWIRE DBL END TROCAR 6X.062 (WIRE) IMPLANT
LABEL OR SOLS (LABEL) ×3 IMPLANT
MANIFOLD NEPTUNE II (INSTRUMENTS) ×3 IMPLANT
NDL FILTER BLUNT 18X1 1/2 (NEEDLE) ×3 IMPLANT
NDL HYPO 22X1.5 SAFETY MO (MISCELLANEOUS) ×3 IMPLANT
NDL HYPO 25X1 1.5 SAFETY (NEEDLE) ×9 IMPLANT
NDL SAFETY ECLIPSE 18X1.5 (NEEDLE) ×3 IMPLANT
NEEDLE FILTER BLUNT 18X1 1/2 (NEEDLE) IMPLANT
NEEDLE HYPO 22X1.5 SAFETY MO (MISCELLANEOUS) ×3 IMPLANT
NEEDLE HYPO 25X1 1.5 SAFETY (NEEDLE) ×3 IMPLANT
NS IRRIG 500ML POUR BTL (IV SOLUTION) ×3 IMPLANT
PACK EXTREMITY ARMC (MISCELLANEOUS) ×3 IMPLANT
PAD CAST 4YDX4 CTTN HI CHSV (CAST SUPPLIES) ×3 IMPLANT
PENCIL SMOKE EVACUATOR (MISCELLANEOUS) ×3 IMPLANT
PIN BALLS 3/8 F/.045 WIRE (MISCELLANEOUS) ×3 IMPLANT
RASP SM TEAR CROSS CUT (RASP) ×3 IMPLANT
SCREW 2.7 HIGH PITCH LOCKING (Screw) IMPLANT
SCREW HIGH PITCH LOCK 2.7 (Screw) IMPLANT
SPLINT CAST 1 STEP 4X30 (MISCELLANEOUS) ×3 IMPLANT
SPLINT PLASTER CAST FAST 5X30 (CAST SUPPLIES) ×3 IMPLANT
STOCKINETTE IMPERV 14X48 (MISCELLANEOUS) ×3 IMPLANT
STOCKINETTE M/LG 89821 (MISCELLANEOUS) ×3 IMPLANT
STRAP SAFETY 5IN WIDE (MISCELLANEOUS) ×3 IMPLANT
STRIP CLOSURE SKIN 1/4X4 (GAUZE/BANDAGES/DRESSINGS) ×3 IMPLANT
SUT ETHILON 5-0 FS-2 18 BLK (SUTURE) IMPLANT
SUT VIC AB 2-0 SH 27XBRD (SUTURE) IMPLANT
SUT VIC AB 4-0 SH 27XANBCTRL (SUTURE) IMPLANT
SUT VICRYL AB 3-0 FS1 BRD 27IN (SUTURE) ×3 IMPLANT
SUTURE ETHLN 4-0 FS2 18XMF BLK (SUTURE) IMPLANT
SUTURE MNCRL 4-0 27XMF (SUTURE) IMPLANT
SYR 10ML LL (SYRINGE) ×6 IMPLANT
SYR 50ML LL SCALE MARK (SYRINGE) ×3 IMPLANT
SYSTEM LAPIPLASTY 4A (Orthopedic Implant) IMPLANT
TRAP FLUID SMOKE EVACUATOR (MISCELLANEOUS) ×3 IMPLANT
WATER STERILE IRR 500ML POUR (IV SOLUTION) ×3 IMPLANT
WIRE Z .045 C-WIRE SPADE TIP (WIRE) ×6 IMPLANT
WIRE Z .062 C-WIRE SPADE TIP (WIRE) ×6 IMPLANT

## 2023-09-08 NOTE — Transfer of Care (Signed)
 Immediate Anesthesia Transfer of Care Note  Patient: Kristen Sandoval  Procedure(s) Performed: FUSION, JOINT, INVOLVING METATARSAL BONE (Left: Toe) BUNIONECTOMY, LAPIDUS (Left: Foot) OSTEOTOMY, WEIL (Left: Foot) CORRECTION, HAMMER TOE (Left: Toe)  Patient Location: PACU  Anesthesia Type:General  Level of Consciousness: awake, alert , and oriented  Airway & Oxygen Therapy: Patient Spontanous Breathing and Patient connected to face mask oxygen  Post-op Assessment: Report given to RN and Post -op Vital signs reviewed and stable  Post vital signs: Reviewed and stable  Last Vitals:  Vitals Value Taken Time  BP 117/69 09/08/23 15:30  Temp 36.1 C 09/08/23 15:30  Pulse 79 09/08/23 15:31  Resp 13 09/08/23 15:31  SpO2 100 % 09/08/23 15:31  Vitals shown include unfiled device data.  Last Pain:  Vitals:   09/08/23 1530  TempSrc:   PainSc: 0-No pain         Complications: No notable events documented.

## 2023-09-08 NOTE — Anesthesia Procedure Notes (Signed)
 Anesthesia Regional Block: Popliteal block   Pre-Anesthetic Checklist: , timeout performed,  Correct Patient, Correct Site, Correct Laterality,  Correct Procedure, Correct Position, site marked,  Risks and benefits discussed,  Surgical consent,  Pre-op evaluation,  At surgeon's request and post-op pain management  Laterality: Lower and Left  Prep: chloraprep       Needles:  Injection technique: Single-shot  Needle Type: Echogenic Needle     Needle Length: 9cm  Needle Gauge: 21     Additional Needles:   Procedures:,,,, ultrasound used (permanent image in chart),,    Narrative:  Start time: 09/08/2023 11:50 AM End time: 09/08/2023 11:52 AM Injection made incrementally with aspirations every 5 mL.  Performed by: Personally  Anesthesiologist: Chesley Lendia CROME, MD  Additional Notes: Patient's chart reviewed and they were deemed appropriate candidate for procedure, at surgeon's request. Patient educated about risks, benefits, and alternatives of the block including but not limited to: temporary or permanent nerve damage, bleeding, infection, damage to surround tissues, block failure, local anesthetic toxicity. Patient expressed understanding. A formal time-out was conducted consistent with institution rules.  Monitors were applied, and minimal sedation used. The site was prepped with skin prep and allowed to dry, and sterile gloves were used. A high frequency linear ultrasound probe with probe cover was utilized throughout. Popliteal artery pulsatile and visualized in popliteal fossa along with adjacent sciatic nerve and its branch point, which appeared anatomically normal, local anesthetic injected around them just proximal to the branch point, and echogenic block needle trajectory was monitored throughout. Aspiration performed every 5ml. Blood vessels were avoided. All injections were performed without resistance and free of blood and paresthesias. The patient tolerated the procedure  well.  Injectate: 20cc exparel 

## 2023-09-08 NOTE — Discharge Instructions (Signed)
[facility]  POST OPERATIVE INSTRUCTIONS FOR DR. Ether Griffins AND DR. BAKER [facility]   Take your medication as prescribed.  Pain medication should be taken only as needed.  Keep the dressing clean, dry and intact.  Keep your foot elevated above the heart level for the first 48 hours.  We have instructed you to be non-weight bearing.  Always wear your post-op shoe when walking.  Always use your crutches if you are to be non-weight bearing.  Do not take a shower. Baths are permissible as long as the foot is kept out of the water.   Every hour you are awake:  Bend your knee 15 times.   Call [facility] [phone]) if any of the following problems occur: You develop a temperature or fever. The bandage becomes saturated with blood. Medication does not stop your pain. Injury of the foot occurs. Any symptoms of infection including redness, odor, or red streaks running from wound.

## 2023-09-08 NOTE — Anesthesia Preprocedure Evaluation (Addendum)
 Anesthesia Evaluation  Patient identified by MRN, date of birth, ID band Patient awake    Reviewed: Allergy & Precautions, NPO status , Patient's Chart, lab work & pertinent test results  History of Anesthesia Complications Negative for: history of anesthetic complications  Airway Mallampati: II  TM Distance: >3 FB Neck ROM: full    Dental no notable dental hx.    Pulmonary asthma , former smoker   Pulmonary exam normal        Cardiovascular negative cardio ROS Normal cardiovascular exam     Neuro/Psych  Neuromuscular disease  negative psych ROS   GI/Hepatic Neg liver ROS,GERD  ,,  Endo/Other  negative endocrine ROS    Renal/GU      Musculoskeletal  (+) Arthritis ,    Abdominal   Peds  Hematology negative hematology ROS (+)   Anesthesia Other Findings Past Medical History: No date: Allergy No date: Anemia No date: Anxiety No date: Arthritis No date: Asthma No date: Bunion of left foot No date: GERD (gastroesophageal reflux disease) No date: Hallux valgus (acquired), left foot No date: Pneumonia No date: Pre-diabetes  Past Surgical History: 1990's: ABDOMINAL HYSTERECTOMY     Comment:  for noncancerous reasons, ovarian cyst and menorrhagia;               NO cervix on exam 10/18/2016 No date: CARPAL TUNNEL RELEASE; Right 2012: COLONOSCOPY     Comment:  in [place], [place]Normal exam per pt 01/2023: JOINT REPLACEMENT; Left     Comment:  knee 09/2009: KNEE SURGERY; Left 1990's: OOPHORECTOMY; Bilateral No date: TONSILLECTOMY AND ADENOIDECTOMY 2012: UPPER GASTROINTESTINAL ENDOSCOPY     Comment:  in [place]     Reproductive/Obstetrics negative OB ROS                              Anesthesia Physical Anesthesia Plan  ASA: 3  Anesthesia Plan: General ETT   Post-op Pain Management: Regional block*, Ofirmev  IV (intra-op)* and Toradol  IV (intra-op)*   Induction:  Intravenous  PONV Risk Score and Plan: 3 and Ondansetron , Dexamethasone , Treatment may vary due to age or medical condition and Midazolam   Airway Management Planned: Oral ETT  Additional Equipment:   Intra-op Plan:   Post-operative Plan: Extubation in OR  Informed Consent: I have reviewed the patients History and Physical, chart, labs and discussed the procedure including the risks, benefits and alternatives for the proposed anesthesia with the patient or authorized representative who has indicated his/her understanding and acceptance.     Dental Advisory Given  Plan Discussed with: Anesthesiologist, CRNA and Surgeon  Anesthesia Plan Comments: (Patient consented for risks of anesthesia including but not limited to:  - adverse reactions to medications - damage to eyes, teeth, lips or other oral mucosa - nerve damage due to positioning  - sore throat or hoarseness - Damage to heart, brain, nerves, lungs, other parts of body or loss of life  Patient voiced understanding and assent.)         Anesthesia Quick Evaluation

## 2023-09-08 NOTE — Op Note (Signed)
 Operative note   Surgeon:Aniza Shor [employer] forces logistics/support/administrative officer: None    Preop diagnosis: 1.  Hallux valgus deformity left foot 2.  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  Hammertoe contracture left second toe    Postop diagnosis: Same    Procedure: 1.  Lapidus hallux valgus correction left foot 2.  Metatarsal cuneiform fusion second metatarsal cuneiform joint left foot 3.  Metatarsal cuneiform fusion left third metatarsal cuneiform left foot 4.  Hammertoe repair left second toe 5.  Metatarsophalangeal joint release of the second MTPJ 6.  Intraoperative fluoroscopy use    EBL: Minimal    Anesthesia: General With popliteal block    Hemostasis: Mid calf tourniquet inflated to 200 mmHg for 120 minutes    Specimen: None    Complications: None    Operative indications:Kristen Sandoval is an 68 y.o. that presents today for surgical intervention.  The risks/benefits/alternatives/complications have been discussed and consent has been given.    Procedure:  Patient was brought into the OR and placed on the operating table in thesupine position. After anesthesia was obtained theleft lower extremity was prepped and draped in usual sterile fashion.  Attention was directed to the dorsal aspect of the left midfoot where longitudinal incision was performed along the third metatarsal cuneiform region.  Sharp and blunt dissection carried down to the muscle belly.  This was retracted medially.  Subperiosteal dissection was then performed and the 2nd and 3rd metatarsocuneiform joints were exposed.  Next the cut guide was used to remove the notch between the cuneiforms.  Next a parallel cut guide was used to remove the articular cartilage down to subchondral bone plate.  The bone was then prepped with a 2.0 mm drill bit and compression was applied.  A dorsal locking plate to the 2nd and 3rd metatarsal cuneiform joints were used from the adductoplast set.  Good compression and alignment was noted  in all planes.  The olive wire to the third metatarsal was removed but the second metatarsal olive wire was left in place.  Attention was then directed to the medial first MTPJ where an incision was performed.  Sharp and blunt dissection carried down to the capsule.  The intermetatarsal space was entered.  The DTI L was transected.  The conjoined tendon of the abductor was released and much more flexibility of the first MTPJ was noted.  Attention then directed to the dorsomedial first met cuneiform joint where incision was performed.  Sharp and blunt dissection carried down to the periosteum.  Subperiosteal dissection was performed.  The Lapiplasty was then performed with standard technique.  Converging cuts were made the metatarsal was realigned and compressed after preparation of the joint with a 2.0 mm drill bit.  Dorsal and medial locking plates were applied from the [facility] plasty set.  Excellent realignment was noted postreduction.  All wounds were then flushed with copious amounts of irrigation.  Attention was then directed to the second toe where dorsal and lateral angulation of the second toe was noted.  At the PIPJ a dorsal incision was performed.  The head of the proximal phalanx and base of the middle phalanx were exposed.  A angled cut was made to realign the second toe in a much better aligned position.  The wider cut was lateral.  Good alignment was noted.  Subtle dorsal contracture was noted at this time.  An incision was made dorsal to the MTPJ and blunt dissection was carried down to the joint.  The dorsal and lateral capsule was then released.  The toe was in a better aligned position.  At this time the wounds were flushed with copious amounts of irrigation.  An Arthrex nitinol wire was driven from the base of the middle phalanx exiting the tip of the toe retrograded back to the proximal phalanx and crossing the MTPJ in an anatomically aligned position.  Good alignment was noted in all planes.   All wounds were flushed 1 last time.  Closure was performed with a 3-0 Vicryl deeper layers and tendons.  4 Vicryl the subcutaneous tissue and a combination of Monocryl and nylon was used for the skin.  A bulky sterile dressing was applied and patient was then placed in an equalizer walker boot.    Patient tolerated the procedure and anesthesia well.  Was transported from the OR to the PACU with all vital signs stable and vascular status intact. To be discharged per routine protocol.  Will follow up in approximately 1 week in the outpatient clinic.

## 2023-09-08 NOTE — Anesthesia Postprocedure Evaluation (Signed)
 Anesthesia Post Note  Patient: Kristen Sandoval  Procedure(s) Performed: FUSION, JOINT, INVOLVING METATARSAL BONE (Left: Toe) BUNIONECTOMY, LAPIDUS (Left: Foot) OSTEOTOMY, WEIL (Left: Foot) CORRECTION, HAMMER TOE (Left: Toe)  Patient location during evaluation: PACU Anesthesia Type: General Level of consciousness: awake and alert Pain management: pain level controlled Vital Signs Assessment: post-procedure vital signs reviewed and stable Respiratory status: spontaneous breathing, nonlabored ventilation, respiratory function stable and patient connected to nasal cannula oxygen Cardiovascular status: blood pressure returned to baseline and stable Postop Assessment: no apparent nausea or vomiting Anesthetic complications: no   No notable events documented.   Last Vitals:  Vitals:   09/08/23 1530 09/08/23 1600  BP: 117/69 115/63  Pulse: 81 77  Resp: 16 15  Temp: (!) 36.1 C   SpO2: 100% 95%    Last Pain:  Vitals:   09/08/23 1600  TempSrc:   PainSc: 0-No pain                 Lendia LITTIE Mae

## 2023-09-08 NOTE — Anesthesia Procedure Notes (Signed)
 Procedure Name: LMA Insertion Date/Time: 09/08/2023 12:06 PM  Performed by: Lorrene Camelia LABOR, CRNAPre-anesthesia Checklist: Patient identified, Patient being monitored, Timeout performed, Emergency Drugs available and Suction available Patient Re-evaluated:Patient Re-evaluated prior to induction Oxygen Delivery Method: Circle system utilized Preoxygenation: Pre-oxygenation with 100% oxygen Induction Type: IV induction Ventilation: Mask ventilation without difficulty LMA: LMA inserted LMA Size: 3.0 Tube type: Oral Number of attempts: 1 Placement Confirmation: positive ETCO2 and breath sounds checked- equal and bilateral Tube secured with: Tape Dental Injury: Teeth and Oropharynx as per pre-operative assessment

## 2023-09-08 NOTE — H&P (Signed)
 HISTORY AND PHYSICAL INTERVAL NOTE:  09/08/2023  11:30 AM  Kristen Sandoval  has presented today for surgery, with the diagnosis of Hallux valgus, left.  The various methods of treatment have been discussed with the patient.  No guarantees were given.  After consideration of risks, benefits and other options for treatment, the patient has consented to surgery.  I have reviewed the patients' chart and labs.     Sandoval history and physical examination was performed in my office.  The patient was reexamined.  There have been no changes to this history and physical examination.  Kristen Sandoval

## 2023-09-12 ENCOUNTER — Encounter: Payer: Self-pay | Admitting: Podiatry

## 2023-11-09 ENCOUNTER — Ambulatory Visit

## 2023-11-09 DIAGNOSIS — D1801 Hemangioma of skin and subcutaneous tissue: Secondary | ICD-10-CM

## 2023-11-09 DIAGNOSIS — W908XXA Exposure to other nonionizing radiation, initial encounter: Secondary | ICD-10-CM

## 2023-11-09 DIAGNOSIS — L814 Other melanin hyperpigmentation: Secondary | ICD-10-CM

## 2023-11-09 DIAGNOSIS — Z1283 Encounter for screening for malignant neoplasm of skin: Secondary | ICD-10-CM | POA: Diagnosis not present

## 2023-11-09 DIAGNOSIS — L821 Other seborrheic keratosis: Secondary | ICD-10-CM | POA: Diagnosis not present

## 2023-11-09 DIAGNOSIS — L578 Other skin changes due to chronic exposure to nonionizing radiation: Secondary | ICD-10-CM

## 2023-11-09 DIAGNOSIS — D229 Melanocytic nevi, unspecified: Secondary | ICD-10-CM

## 2023-11-09 DIAGNOSIS — L570 Actinic keratosis: Secondary | ICD-10-CM

## 2023-11-09 NOTE — Patient Instructions (Addendum)
 - Start a face moisturizer when dry --  for people with acne the lotion or cream should be oil-free and non-comedogenic - Examples are: CereVe PM, Cetaphil Face Lotion, Olay Complete, Neutragena Hydro Boost Gel Cream      Cryosurgery  Cryosurgery ("freezing") uses liquid nitrogen to destroy certain types of skin lesions. Lowering the temperature of the lesion in a small area surrounding skin destroys the lesion. Immediately following cryosurgery, you will notice redness and swelling of the treatment area. Blistering or weeping may occur, lasting approximately one week which will then be followed by crusting. Most areas will heal completely in 10 to 14 days.  Wash the treated areas daily. Allow soap and water to run over the areas, but do not scrub. Should a scab or crust form, allow it to fall off on its own. Do not remove or pick at it. Application of an ointment  and a bandage may make you feel more comfortable, but it is not necessary. Some people develop an allergy to Neosporin, so we recommend that Vaseline or  Aquaphor be used.  The cryotherapy site will be more sensitive than your surrounding skin. Keep it covered, and remember to apply sunscreen every day to all your sun exposed skin. A scar may remain which is lighter or pinker than your normal skin. Your body will continue to improve your scar for up to one year; however a light-colored scar may remain.  Infection following cryotherapy is rare. However if you are worried about the appearance of the treated area, contact your doctor. We have a physician on call at all times. If you have any concerns about the site, please call our clinic at [phone]  Recommended sunscreens for face for patients with melasma: - La Roche-Posay Anthelios Mineral SPF 50 Sunscreen Tinted - SkinCeuticals Physical Fusion UV Defense Tinted Mineral - EltaMD UV Physical Tinted Facial Suncreen SPF 41 - Avene Mineral High Protection Tinted Compact SPF 50 --  this comes in darker tints  Sunscreen  Who needs sunscreen? Everyone. Sunscreen use can help prevent skin cancer by protecting you from the sun's harmful ultraviolet rays. Anyone can get skin cancer, regardless of age, gender or race. In fact, it is estimated that one in five Americans will develop skin cancer in their lifetime.  Sunscreen alone cannot fully protect you. In addition to wearing sunscreen, dermatologists recommend taking the following steps to protect your skin and find skin cancer early:  Seek shade when appropriate, remembering that the sun's rays are strongest between 10 a.m. and 2 p.m. If your shadow is shorter than you are, seek shade. Dress to protect yourself from the sun by wearing a lightweight long-sleeved shirt, pants, a wide-brimmed hat and sunglasses, when possible.  Use extra caution near water, snow and sand as they reflect the damaging rays of the sun, which can increase your chance of sunburn.  Get vitamin D  safely through a healthy diet that may include vitamin supplements. Don't seek the sun. Avoid tanning beds. Ultraviolet light from the sun and tanning beds can cause skin cancer and wrinkling. If you want to look tan, you may wish to use a self-tanning product, but continue to use sunscreen with it.  When should I use sunscreen? Every day you go outside--even if you're just walking to and from your form of transportation. The sun emits harmful UV rays year-round. Even on cloudy days, up to 80 percent of the sun's harmful UV rays can penetrate your skin. Snow, sand and water  increase the need for sunscreen because they reflect the sun's rays.  How much sunscreen should I use, and how often should I apply it? Most people only apply 25-50 percent of the recommended amount of sunscreen. Apply enough sunscreen to cover all exposed skin. Most adults need about 1 ounce -- or enough to fill a shot glass -- to fully cover their body.  Don't forget to apply to the tops of  your feet, your neck, your ears and the top of your head. Apply sunscreen to dry skin 15 minutes before going outdoors.  Skin cancer also can form on the lips. To protect your lips, apply a lip balm or lipstick that contains sunscreen with an SPF of 30 or higher.  When outdoors, reapply sunscreen approximately every two hours, or after swimming or sweating, according to the directions on the bottle.   Broad-spectrum sunscreens protect against both UVA and UVB rays. What is the difference between the rays? Sunlight consists of two types of harmful rays that reach the earth -- UVA rays and UVB rays. Overexposure to either can lead to skin cancer. In addition to causing skin cancer, here's what each of these rays do:  UVA rays (or aging rays) can prematurely age your skin, causing wrinkles and age spots, and can pass through window glass. UVB rays (or burning rays) are the primary cause of sunburn and are blocked by window glass  There is no safe way to tan. Every time you tan, you damage your skin. As this damage builds, you speed up the aging of your skin and increase your risk for all types of skin cancer.  What is the difference between chemical and physical sunscreens? Chemical sunscreens work like a sponge, absorbing the sun's rays. They contain one or more of the following active ingredients: oxybenzone, avobenzone, octisalate, octocrylene, homosalate and octinoxate. These formulations tend to be easier to rub into the skin without leaving a white residue.   Physical sunscreens work like a shield, sitting sit on the surface of your skin and deflecting the sun's rays. They contain the active ingredients zinc oxide and/or titanium dioxide. Use this sunscreen if you have sensitive skin.   What type of sunscreen should I use? The best type of sunscreen is the one you will use again and again. Just make sure it offers broad-spectrum (UVA and UVB) protection, has an SPF of 30+, and is water-resistant.  The kind of sunscreen you use is a matter of personal choice, and may vary depending on the area of the body to be protected. Available sunscreen options include lotions, creams, gels, ointments, wax sticks and sprays.  Recommended physical sunscreens for face: - Neutrogena Sheer Zinc - Aveeno Positively Mineral Sensitive - CeraVe Hydrating Mineral (also has a tinted version) - La Roche-Posay Anthelios Mineral Face (comes as a cream, lotion, light fluid, and there is also a tinted version).  - EltaMD UV Clear (also has a tinted version)  Recommended physical sunscreens for body: - Neutrogena Sheer Zinc Dry-Touch Sunscreen Sensitive Skin Lotion Broad Spectrum SPF 50 - Aveeno Positively Mineral Sensitive Skin Sunscreen Broad Spectrum SPF 50 - La Roche-Posay Anthelios SPF 50 Mineral Sunscreen - Gentle Lotion - CeraVe Hydrating Mineral Sunscreen SPF 50  Recommended chemical sunscreens for face: - Anthelios UV Correct Face Sunscreen SPF 70 with Niacinamide - Neutrogena Clear Face Oil-Free SPF 50 with Helioplex - Neutrogena Sport Face Oil-Free SPF 70+ with Helioplex - Aveeno Protect + Hydrate Sunscreen For Face SPF 70 - La  Roche-Posay Anthelios Light Fluid Sunscreen for Face SPF 60  Recommended chemical sunscreens for body: - Neutrogena Ultra Sheer Dry-Touch Sunscreen SPF 70 - Aveeno Protect + Hydrate Broad Spectrum All-Day Hydration SPF 60 (comes in a big pump) - La Roche-Posay Anthelios Melt-In Milk Sunscreen SPF 60   Melanoma ABCDEs  Melanoma is the most dangerous type of skin cancer, and is the leading cause of death from skin disease.  You are more likely to develop melanoma if you: Have light-colored skin, light-colored eyes, or red or blond hair Spend a lot of time in the sun Tan regularly, either outdoors or in a tanning bed Have had blistering sunburns, especially during childhood Have a close family member who has had a melanoma Have atypical moles or large birthmarks  Early  detection of melanoma is key since treatment is typically straightforward and cure rates are extremely high if we catch it early.   The first sign of melanoma is often a change in a mole or a new dark spot.  The ABCDE system is a way of remembering the signs of melanoma.  A for asymmetry:  The two halves do not match. B for border:  The edges of the growth are irregular. C for color:  A mixture of colors are present instead of an even brown color. D for diameter:  Melanomas are usually (but not always) greater than 6mm - the size of a pencil eraser. E for evolution:  The spot keeps changing in size, shape, and color.  Please check your skin once per month between visits. You can use a small mirror in front and a large mirror behind you to keep an eye on the back side or your body.   If you see any new or changing lesions before your next follow-up, please call to schedule a visit.  Please continue daily skin protection including broad spectrum sunscreen SPF 30+ to sun-exposed areas, reapplying every 2 hours as needed when you're outdoors.   Staying in the shade or wearing long sleeves, sun glasses (UVA+UVB protection) and wide brim hats (4-inch brim around the entire circumference of the hat) are also recommended for sun protection.       Due to recent changes in healthcare laws, you may see results of your pathology and/or laboratory studies on MyChart before the doctors have had a chance to review them. We understand that in some cases there may be results that are confusing or concerning to you. Please understand that not all results are received at the same time and often the doctors may need to interpret multiple results in order to provide you with the best plan of care or course of treatment. Therefore, we ask that you please give us  2 business days to thoroughly review all your results before contacting the office for clarification. Should we see a critical lab result, you will be  contacted sooner.   If You Need Anything After Your Visit  If you have any questions or concerns for your doctor, please call our main line at [phone] and press option 4 to reach your doctor's medical assistant. If no one answers, please leave a voicemail as directed and we will return your call as soon as possible. Messages left after 4 pm will be answered the following business day.   You may also send us  a message via MyChart. We typically respond to MyChart messages within 1-2 business days.  For prescription refills, please ask your pharmacy to contact our office. Our fax  number is [phone].  If you have an urgent issue when the clinic is closed that cannot wait until the next business day, you can page your doctor at the number below.    Please note that while we do our best to be available for urgent issues outside of office hours, we are not available 24/7.   If you have an urgent issue and are unable to reach us , you may choose to seek medical care at your doctor's office, retail clinic, urgent care center, or emergency room.  If you have a medical emergency, please immediately call 911 or go to the emergency department.  Pager Numbers  - Dr. Hester: [phone]  - Dr. Jackquline: [phone]  - Dr. Claudene: [phone]   In the event of inclement weather, please call our main line at [phone] for an update on the status of any delays or closures.  Dermatology Medication Tips: Please keep the boxes that topical medications come in in order to help keep track of the instructions about where and how to use these. Pharmacies typically print the medication instructions only on the boxes and not directly on the medication tubes.   If your medication is too expensive, please contact our office at [phone] option 4 or send us  a message through MyChart.   We are unable to tell what your co-pay for medications will be in advance as this is different depending on  your insurance coverage. However, we may be able to find a substitute medication at lower cost or fill out paperwork to get insurance to cover a needed medication.   If a prior authorization is required to get your medication covered by your insurance company, please allow us  1-2 business days to complete this process.  Drug prices often vary depending on where the prescription is filled and some pharmacies may offer cheaper prices.  The website www.goodrx.com contains coupons for medications through different pharmacies. The prices here do not account for what the cost may be with help from insurance (it may be cheaper with your insurance), but the website can give you the price if you did not use any insurance.  - You can print the associated coupon and take it with your prescription to the pharmacy.  - You may also stop by our office during regular business hours and pick up a GoodRx coupon card.  - If you need your prescription sent electronically to a different pharmacy, notify our office through [facility] or by phone at [phone] option 4.     Si Usted Necesita Algo Despus de Su Visita  Tambin puede enviarnos un mensaje a travs de Clinical cytogeneticist. Por lo general respondemos a los mensajes de MyChart en el transcurso de 1 a 2 das hbiles.  Para renovar recetas, por favor pida a su farmacia que se ponga en contacto con nuestra oficina. Randi lakes de fax es [place] [phone].  Si tiene un asunto urgente cuando la clnica est cerrada y que no puede esperar hasta el siguiente da hbil, puede llamar/localizar a su doctor(a) al nmero que aparece a continuacin.   Por favor, tenga en cuenta que aunque hacemos todo lo posible para estar disponibles para asuntos urgentes fuera del horario de [place], no estamos disponibles las 24 horas del da, los 7 [address]  [address] de la [place].   Si tiene un problema urgente y no puede comunicarse con nosotros, puede optar por buscar atencin mdica  en el  consultorio de su doctor(a), en una clnica privada, en un centro  de atencin urgente o en una sala de emergencias.  Si tiene Engineer, drilling, por favor llame inmediatamente al 911 o vaya a la sala de emergencias.  Nmeros de bper  - Dr. Hester: [phone]  - Dra. Jackquline: [phone]  - Dr. Claudene: [phone]   En caso de inclemencias del tiempo, por favor llame a landry capes principal al [phone] para una actualizacin sobre el [place] de cualquier retraso o cierre.  Consejos para la medicacin en dermatologa: Por favor, guarde las cajas en las que vienen los medicamentos de uso tpico para ayudarle a seguir las instrucciones sobre dnde y cmo usarlos. Las farmacias generalmente imprimen las instrucciones del medicamento slo en las cajas y no directamente en los tubos del [place].   Si su medicamento es muy caro, por favor, pngase en contacto con landry rieger llamando al [phone] y presione la opcin 4 o envenos un mensaje a travs de Clinical cytogeneticist.   No podemos decirle cul ser su copago por los medicamentos por adelantado ya que esto es diferente dependiendo de la cobertura de su seguro. Sin embargo, es posible que podamos encontrar un medicamento sustituto a Audiological scientist un formulario para que el seguro cubra el medicamento que se considera necesario.   Si se requiere una autorizacin previa para que su compaa de seguros malta su medicamento, por favor permtanos de 1 a 2 das hbiles para completar este proceso.  Los precios de los medicamentos varan con frecuencia dependiendo del Environmental consultant de dnde se surte la receta y alguna farmacias pueden ofrecer precios ms baratos.  El sitio web www.goodrx.com tiene cupones para medicamentos de Health and safety inspector. Los precios aqu no tienen en cuenta lo que podra costar con la ayuda del seguro (puede ser ms barato con su seguro), pero el sitio web puede darle el precio si no utiliz Tourist information centre manager.  -  Puede imprimir el cupn correspondiente y llevarlo con su receta a la farmacia.  - Tambin puede pasar por nuestra oficina durante el horario de atencin regular y Education officer, museum una tarjeta de cupones de GoodRx.  - Si necesita que su receta se enve electrnicamente a una farmacia diferente, informe a nuestra oficina a travs de MyChart de [place] o por telfono llamando al [phone] y presione la opcin 4.

## 2023-11-09 NOTE — Progress Notes (Signed)
 Subjective   Kristen Sandoval is a 68 y.o. female who presents for the following: Total body skin exam for skin cancer screening and mole check. The patient has spots, moles and lesions to be evaluated, some may be new or changing and the patient may have concern these could be cancer. Patient is new patient  Today patient reports: Patient here for total body skin check, has been years since she has been to a dermatologist. Patient states she is adopted so unsure of family hx. Patient does has some places she would like checked. Reports she has had places treated with cryotherapy with previous dermatologist. Recent foot surgery at left foot.   Review of Systems:    No other skin or systemic complaints except as noted in HPI or Assessment and Plan.  The following portions of the chart were reviewed this encounter and updated as appropriate: medications, allergies, medical history  Relevant Medical History:  n/a   Objective  Well appearing patient in no apparent distress; mood and affect are within normal limits. Examination was performed of the: Full Skin Examination: scalp, head, eyes, ears, nose, lips, neck, chest, axillae, abdomen, back, buttocks, bilateral upper extremities, bilateral lower extremities, hands, feet, fingers, toes, fingernails, and toenails.   Examination notable for: Angioma(s): Scattered red vascular papule(s)  , Lentigo/lentigines: Scattered pigmented macules that are tan to brown in color and are somewhat non-uniform in shape and concentrated in the sun-exposed areas, Nevus/nevi: Scattered well-demarcated, regular, pigmented macule(s) and/or papule(s)  , Seborrheic Keratosis(es): Stuck-on appearing keratotic papule(s) on the trunk, some  irritated with redness, crusting, edema, and/or partial avulsion, Actinic Damage/Elastosis: chronic sun damage: dyspigmentation, telangiectasia, and wrinkling, Actinic keratosis: Scaly erythematous macule(s) concentrated on sun exposed  areas   Examination limited by: Undergarments and Patient deferred removal     Face/arms/legs x7 (7) Pink scaly macules  Assessment & Plan   SKIN CANCER SCREENING PERFORMED TODAY.  BENIGN SKIN FINDINGS  - Lentigines  - Seborrheic keratoses  - Hemangiomas   - Nevus/Multiple Benign Nevi  - Reassurance provided regarding the benign appearance of lesions noted on exam today; no treatment is indicated in the absence of symptoms/changes. - Reinforced importance of photoprotective strategies including liberal and frequent sunscreen use of a broad-spectrum SPF 30 or greater, use of protective clothing, and sun avoidance for prevention of cutaneous malignancy and photoaging.  Counseled patient on the importance of regular self-skin monitoring as well as routine clinical skin examinations as scheduled.   ACTINIC DAMAGE - Chronic condition, secondary to cumulative UV/sun exposure - Recommend daily broad spectrum sunscreen SPF 30+ to sun-exposed areas, reapply every 2 hours as needed.  - Staying in the shade or wearing long sleeves, sun glasses (UVA+UVB protection) and wide brim hats (4-inch brim around the entire circumference of the hat) are also recommended for sun protection.  - Call for new or changing lesions.  Diffuse actinic skin damage with actinic keratosis  - Evidence of actinic skin damage on exam today. This is a chronic with expected duration over 1 year. A portion of actinic keratoses will progress to squamous cell carcinoma of the skin. It is not possible to reliably predict which spots will progress to skin cancer and so treatment is recommended to prevent development of skin cancer. - Reviewed sun avoidance practices, including protective clothing and hat, midday sun avoidance, sunscreen SPF>30 applications Q2-3H when in sun especially after sweating - Reviewed signs/symptoms of skin cancer, patient asked come in sooner if these appear. -  Discussed treatment options - Underwent  cryo as per below  - Patient defers efudex 5% cream, may consider in the future.   Level of service outlined above   Procedures, orders, diagnosis for this visit:  AK (ACTINIC KERATOSIS) (7) Face/arms/legs x7 (7) Actinic keratoses are precancerous spots that appear secondary to cumulative UV radiation exposure/sun exposure over time. They are chronic with expected duration over 1 year. A portion of actinic keratoses will progress to squamous cell carcinoma of the skin. It is not possible to reliably predict which spots will progress to skin cancer and so treatment is recommended to prevent development of skin cancer.  Recommend daily broad spectrum sunscreen SPF 30+ to sun-exposed areas, reapply every 2 hours as needed.  Recommend staying in the shade or wearing long sleeves, sun glasses (UVA+UVB protection) and wide brim hats (4-inch brim around the entire circumference of the hat). Call for new or changing lesions. Destruction of lesion - Face/arms/legs x7 (7) Complexity: simple   Destruction method: cryotherapy   Informed consent: discussed and consent obtained   Timeout:  patient name, date of birth, surgical site, and procedure verified Lesion destroyed using liquid nitrogen: Yes   Region frozen until ice ball extended beyond lesion: Yes   Cryo cycles: 1 or 2. Outcome: patient tolerated procedure well with no complications   Post-procedure details: wound care instructions given   Additional details:  Prior to procedure, discussed risks of blister formation, small wound, skin dyspigmentation, or rare scar following cryotherapy. Recommend Vaseline ointment to treated areas while healing.    AK (actinic keratosis) -     Destruction of lesion   Return to clinic: Return in about 1 year (around 11/08/2024) for TBSE, w/ Dr. Raymund.  I, Jacquelynn V. Wilfred, CMA, am acting as scribe for Lauraine JAYSON Raymund, MD .  Documentation: I have reviewed the above documentation for accuracy and  completeness, and I agree with the above.  Lauraine JAYSON Raymund, MD

## 2023-11-14 ENCOUNTER — Encounter: Payer: Self-pay | Admitting: Family

## 2023-11-14 ENCOUNTER — Ambulatory Visit (INDEPENDENT_AMBULATORY_CARE_PROVIDER_SITE_OTHER): Payer: BC Managed Care – PPO | Admitting: Family

## 2023-11-14 ENCOUNTER — Telehealth: Payer: Self-pay | Admitting: Family

## 2023-11-14 VITALS — BP 122/86 | HR 78 | Temp 97.6°F | Ht 61.0 in | Wt 126.2 lb

## 2023-11-14 DIAGNOSIS — M79641 Pain in right hand: Secondary | ICD-10-CM

## 2023-11-14 DIAGNOSIS — F419 Anxiety disorder, unspecified: Secondary | ICD-10-CM

## 2023-11-14 DIAGNOSIS — M8580 Other specified disorders of bone density and structure, unspecified site: Secondary | ICD-10-CM | POA: Diagnosis not present

## 2023-11-14 DIAGNOSIS — Z0000 Encounter for general adult medical examination without abnormal findings: Secondary | ICD-10-CM | POA: Diagnosis not present

## 2023-11-14 DIAGNOSIS — R7303 Prediabetes: Secondary | ICD-10-CM

## 2023-11-14 DIAGNOSIS — E7800 Pure hypercholesterolemia, unspecified: Secondary | ICD-10-CM | POA: Diagnosis not present

## 2023-11-14 DIAGNOSIS — Z1211 Encounter for screening for malignant neoplasm of colon: Secondary | ICD-10-CM

## 2023-11-14 DIAGNOSIS — F32A Depression, unspecified: Secondary | ICD-10-CM

## 2023-11-14 DIAGNOSIS — M79642 Pain in left hand: Secondary | ICD-10-CM | POA: Diagnosis not present

## 2023-11-14 DIAGNOSIS — Z1231 Encounter for screening mammogram for malignant neoplasm of breast: Secondary | ICD-10-CM | POA: Diagnosis not present

## 2023-11-14 LAB — LIPID PANEL
Cholesterol: 265 mg/dL — ABNORMAL HIGH (ref 0–200)
HDL: 96.3 mg/dL (ref >39.00)
LDL Cholesterol: 142 mg/dL — ABNORMAL HIGH (ref 0–99)
NonHDL: 169.02
Total CHOL/HDL Ratio: 3
Triglycerides: 134 mg/dL (ref 0.0–149.0)
VLDL: 26.8 mg/dL (ref 0.0–40.0)

## 2023-11-14 LAB — TSH: TSH: 1.69 u[IU]/mL (ref 0.35–5.50)

## 2023-11-14 LAB — VITAMIN D 25 HYDROXY (VIT D DEFICIENCY, FRACTURES): VITD: 47.29 ng/mL (ref 30.00–100.00)

## 2023-11-14 LAB — HEMOGLOBIN A1C: Hgb A1c MFr Bld: 5.8 % (ref 4.6–6.5)

## 2023-11-14 MED ORDER — IBUPROFEN 800 MG PO TABS: 800.0000 mg | ORAL_TABLET | Freq: Every day | ORAL | 1 refills | Status: DC | PRN

## 2023-11-14 MED ORDER — FLUOXETINE HCL 20 MG PO CAPS: 20.0000 mg | ORAL_CAPSULE | Freq: Every morning | ORAL | 3 refills | Status: AC

## 2023-11-14 NOTE — Assessment & Plan Note (Signed)
 CBE performed.  Colonoscopy referral placed.

## 2023-11-14 NOTE — Progress Notes (Unsigned)
 Assessment & Plan:  Routine physical examination Assessment & Plan: CBE performed.  Colonoscopy referral placed.  Orders: -     FLUoxetine  HCl; Take 1 capsule (20 mg total) by mouth every morning.  Dispense: 90 capsule; Refill: 3 -     VITAMIN D  25 Hydroxy (Vit-D Deficiency, Fractures) -     TSH -     Hemoglobin A1c -     Lipid panel -     Ambulatory referral to Gastroenterology  Encounter for screening mammogram for malignant neoplasm of breast -     3D Screening Mammogram, Left and Right; Future  Bilateral hand pain Assessment & Plan: Chronic, worsening.  Discussed enlarged MCP joint, particularly of right hand and concern for rheumatoid arthritis.  ANA, RF and CRP normal and reassuring last month as ordered by podiatry.  Referral to Dr. Tobie for second opinion.  Advised to continue Voltaren  gel, icing.  Orders: -     Ibuprofen ; Take 1 tablet (800 mg total) by mouth daily as needed (pain.).  Dispense: 30 tablet; Refill: 1 -     DG Hand Complete Right; Future -     DG Hand Complete Left; Future -     Ambulatory referral to Rheumatology  Anxiety and depression Assessment & Plan: Chronic, stable.  Continue Prozac  20 mg qd  Orders: -     FLUoxetine  HCl; Take 1 capsule (20 mg total) by mouth every morning.  Dispense: 90 capsule; Refill: 3  Prediabetes -     Hemoglobin A1c  Osteopenia, unspecified location -     VITAMIN D  25 Hydroxy (Vit-D Deficiency, Fractures)  Hypercholesterolemia without hypertriglyceridemia -     Lipid panel  Screen for colon cancer -     Ambulatory referral to Gastroenterology     Return precautions given.   Risks, benefits, and alternatives of the medications and treatment plan prescribed today were discussed, and patient expressed understanding.   Education regarding symptom management and diagnosis given to patient on AVS either electronically or printed.  No follow-ups on file.  Rollene Northern, FNP  Subjective:    Patient ID:  Kristen Sandoval, female    DOB: 09-15-1955, 68 y.o.   MRN: [id number]  CC: Kristen Sandoval is a 68 y.o. female who presents today for physical exam.    HPI: Complains of BL hand pain Describes of episodic heat in base of fingers, swelling. Endorses weakness in hands.   Denies numbness.   No trauma.  H/o CTS right hand, s/p repair.    She had left TKR last year 02/03/2024 and has done well. She takes occasional ibuprofen  800mg  with food.    Dr Ashley recently ordered labs for concern RA.   10/24/23 ANA  neg, RA factor < 14, and CRP 6.     Colorectal Cancer Screening: Up-to-date 10/13/2020, Dr Wilhelmenia,  1 polyp, internal and external hemorrhoids; polyp precancerous, recommend repeat colonoscopy in 3 years time  Breast Cancer Screening: Mammogram UTD Cervical Cancer Screening: Hysterectomy for noncancerous reasons, ovarian cyst and menorrhagia; NO cervix on exam 10/18/2016  She is no longer screening for cervical cancer  Bone Health screening/DEXA for 65+:UTD 01/2023; following Dr Orpha Beverley Economy; she is on Evista  Lung Cancer Screening: She is outside of 15 year window.   No known family history of AAA.        Tetanus - UTD        Pneumococcal - Complete Exercise: Gets regular exercise , 2 days per week,walking.  Alcohol  use:  moderate use Smoking/tobacco use: former smoker, quit 2006.    Health Maintenance  Topic Date Due   Colon Cancer Screening  10/14/2023   COVID-19 Vaccine (6 - Pfizer risk 2024-25 season) 10/30/2023   Breast Cancer Screening  01/31/2025   DTaP/Tdap/Td vaccine (3 - Td or Tdap) 11/11/2032   Pneumococcal Vaccine for age over 82  Completed   Flu Shot  Completed   DEXA scan (bone density measurement)  Completed   Hepatitis C Screening  Completed   HIV Screening  Completed   Zoster (Shingles) Vaccine  Completed   Hepatitis B Vaccine  Aged Out   HPV Vaccine  Aged Out   Meningitis B Vaccine  Aged Out    ALLERGIES: Biaxin [clarithromycin], Latex,  and Erythromycin  Current Outpatient Medications on File Prior to Visit  Medication Sig Dispense Refill   Cholecalciferol (VITAMIN D3) 50 MCG (2000 UT) TABS Take 2,000 Units by mouth daily.     raloxifene (EVISTA) 60 MG tablet Take 60 mg by mouth daily after breakfast.     vitamin k 100 MCG tablet Take 100 mcg by mouth daily.     No current facility-administered medications on file prior to visit.    Review of Systems  Constitutional:  Negative for chills and fever.  Respiratory:  Negative for cough.   Cardiovascular:  Negative for chest pain and palpitations.  Gastrointestinal:  Negative for nausea and vomiting.      Objective:    BP 122/86   Pulse 78   Temp 97.6 F (36.4 C) (Oral)   Ht 5' 1 (1.549 m)   Wt 126 lb 3.2 oz (57.2 kg)   SpO2 98%   BMI 23.85 kg/m   BP Readings from Last 3 Encounters:  11/14/23 122/86  09/08/23 132/66  11/11/22 128/86   Wt Readings from Last 3 Encounters:  11/14/23 126 lb 3.2 oz (57.2 kg)  09/08/23 125 lb (56.7 kg)  11/11/22 125 lb 1.6 oz (56.7 kg)    Physical Exam Vitals reviewed.  Constitutional:      Appearance: Normal appearance. She is well-developed.  Eyes:     Conjunctiva/sclera: Conjunctivae normal.  Neck:     Thyroid : No thyroid  mass or thyromegaly.  Cardiovascular:     Rate and Rhythm: Normal rate and regular rhythm.     Pulses: Normal pulses.     Heart sounds: Normal heart sounds.  Pulmonary:     Effort: Pulmonary effort is normal.     Breath sounds: Normal breath sounds. No wheezing, rhonchi or rales.  Chest:  Breasts:    Breasts are symmetrical.     Right: No inverted nipple, mass, nipple discharge, skin change or tenderness.     Left: No inverted nipple, mass, nipple discharge, skin change or tenderness.  Abdominal:     General: Bowel sounds are normal. There is no distension.     Palpations: Abdomen is soft. Abdomen is not rigid. There is no fluid wave or mass.     Tenderness: There is no abdominal tenderness.  There is no guarding or rebound.  Musculoskeletal:     Right hand: Tenderness present. Normal range of motion.     Left hand: Tenderness present. Normal range of motion.     Comments:  Tenderness over enlarged, BL MCP, notably in right hand.  Evidence swan neck deformities of the fingers, right hand.   Lymphadenopathy:     Head:     Right side of head: No submental, submandibular, tonsillar, preauricular, posterior  auricular or occipital adenopathy.     Left side of head: No submental, submandibular, tonsillar, preauricular, posterior auricular or occipital adenopathy.     Cervical: No cervical adenopathy.     Right cervical: No superficial, deep or posterior cervical adenopathy.    Left cervical: No superficial, deep or posterior cervical adenopathy.  Skin:    General: Skin is warm and dry.  Neurological:     Mental Status: She is alert.  Psychiatric:        Speech: Speech normal.        Behavior: Behavior normal.        Thought Content: Thought content normal.

## 2023-11-14 NOTE — Telephone Encounter (Signed)
 Call   Dr Orpha Beverley Economy  Treating osteopenia, she is on Evista  We OV notes

## 2023-11-14 NOTE — Telephone Encounter (Signed)
 Called Dr Jacklin office [phone] spoke to receptionist she is faxing over ov notes fax # was given

## 2023-11-14 NOTE — Patient Instructions (Signed)
 Referral to rheumatology and for colonoscopy  Let us  know if you dont hear back within 2 weeks in regards to an appointment being scheduled.   So that you are aware, if you are [facility] MyChart user , please pay attention to your MyChart messages as you may receive a MyChart message with a phone number to call and schedule this test/appointment own your own from our referral coordinator. This is a new process so I do not want you to miss this message.  If you are not a MyChart user, you will receive a phone call.    Health Maintenance for Postmenopausal Women Menopause is a normal process in which your ability to get pregnant comes to an end. This process happens slowly over many months or years, usually between the ages of 23 and 55. Menopause is complete when you have missed your menstrual period for 12 months. It is important to talk with your health care provider about some of the most common conditions that affect women after menopause (postmenopausal women). These include heart disease, cancer, and bone loss (osteoporosis). Adopting a healthy lifestyle and getting preventive care can help to promote your health and wellness. The actions you take can also lower your chances of developing some of these common conditions. What are the signs and symptoms of menopause? During menopause, you may have the following symptoms: Hot flashes. These can be moderate or severe. Night sweats. Decrease in sex drive. Mood swings. Headaches. Tiredness (fatigue). Irritability. Memory problems. Problems falling asleep or staying asleep. Talk with your health care provider about treatment options for your symptoms. Do I need hormone replacement therapy? Hormone replacement therapy is effective in treating symptoms that are caused by menopause, such as hot flashes and night sweats. Hormone replacement carries certain risks, especially as you become older. If you are thinking about using estrogen or estrogen with  progestin, discuss the benefits and risks with your health care provider. How can I reduce my risk for heart disease and stroke? The risk of heart disease, heart attack, and stroke increases as you age. One of the causes may be a change in the body's hormones during menopause. This can affect how your body uses dietary fats, triglycerides, and cholesterol. Heart attack and stroke are medical emergencies. There are many things that you can do to help prevent heart disease and stroke. Watch your blood pressure High blood pressure causes heart disease and increases the risk of stroke. This is more likely to develop in people who have high blood pressure readings or are overweight. Have your blood pressure checked: Every 3-5 years if you are 43-49 years of age. Every year if you are 40 years old or older. Eat a healthy diet  Eat a diet that includes plenty of vegetables, fruits, low-fat dairy products, and lean protein. Do not eat a lot of foods that are high in solid fats, added sugars, or sodium. Get regular exercise Get regular exercise. This is one of the most important things you can do for your health. Most adults should: Try to exercise for at least 150 minutes each week. The exercise should increase your heart rate and make you sweat (moderate-intensity exercise). Try to do strengthening exercises at least twice each week. Do these in addition to the moderate-intensity exercise. Spend less time sitting. Even light physical activity can be beneficial. Other tips Work with your health care provider to achieve or maintain a healthy weight. Do not use any products that contain nicotine or tobacco. These  products include cigarettes, chewing tobacco, and vaping devices, such as e-cigarettes. If you need help quitting, ask your health care provider. Know your numbers. Ask your health care provider to check your cholesterol and your blood sugar (glucose). Continue to have your blood tested as  directed by your health care provider. Do I need screening for cancer? Depending on your health history and family history, you may need to have cancer screenings at different stages of your life. This may include screening for: Breast cancer. Cervical cancer. Lung cancer. Colorectal cancer. What is my risk for osteoporosis? After menopause, you may be at increased risk for osteoporosis. Osteoporosis is a condition in which bone destruction happens more quickly than new bone creation. To help prevent osteoporosis or the bone fractures that can happen because of osteoporosis, you may take the following actions: If you are 48-58 years old, get at least 1,000 mg of calcium and at least 600 international units (IU) of vitamin D  per day. If you are older than age 50 but younger than age 14, get at least 1,200 mg of calcium and at least 600 international units (IU) of vitamin D  per day. If you are older than age 5, get at least 1,200 mg of calcium and at least 800 international units (IU) of vitamin D  per day. Smoking and drinking excessive alcohol increase the risk of osteoporosis. Eat foods that are rich in calcium and vitamin D , and do weight-bearing exercises several times each week as directed by your health care provider. How does menopause affect my mental health? Depression may occur at any age, but it is more common as you become older. Common symptoms of depression include: Feeling depressed. Changes in sleep patterns. Changes in appetite or eating patterns. Feeling an overall lack of motivation or enjoyment of activities that you previously enjoyed. Frequent crying spells. Talk with your health care provider if you think that you are experiencing any of these symptoms. General instructions See your health care provider for regular wellness exams and vaccines. This may include: Scheduling regular health, dental, and eye exams. Getting and maintaining your vaccines. These  include: Influenza vaccine. Get this vaccine each year before the flu season begins. Pneumonia vaccine. Shingles vaccine. Tetanus, diphtheria, and pertussis (Tdap) booster vaccine. Your health care provider may also recommend other immunizations. Tell your health care provider if you have ever been abused or do not feel safe at home. Summary Menopause is a normal process in which your ability to get pregnant comes to an end. This condition causes hot flashes, night sweats, decreased interest in sex, mood swings, headaches, or lack of sleep. Treatment for this condition may include hormone replacement therapy. Take actions to keep yourself healthy, including exercising regularly, eating a healthy diet, watching your weight, and checking your blood pressure and blood sugar levels. Get screened for cancer and depression. Make sure that you are up to date with all your vaccines. This information is not intended to replace advice given to you by your health care provider. Make sure you discuss any questions you have with your health care provider. Document Revised: 07/06/2020 Document Reviewed: 07/06/2020 Elsevier Patient Education  2024 [employer].

## 2023-11-14 NOTE — Assessment & Plan Note (Signed)
 Chronic, worsening.  Discussed enlarged MCP joint, particularly of right hand and concern for rheumatoid arthritis.  ANA, RF and CRP normal and reassuring last month as ordered by podiatry.  Referral to Dr. Tobie for second opinion.  Advised to continue Voltaren  gel, icing.

## 2023-11-14 NOTE — Assessment & Plan Note (Signed)
Chronic, stable   Continue Prozac 20 mg qd

## 2023-11-23 ENCOUNTER — Ambulatory Visit
Admission: RE | Admit: 2023-11-23 | Discharge: 2023-11-23 | Disposition: A | Discharge status: Home / Self Care | Source: Ambulatory Visit | Attending: Family

## 2023-11-23 ENCOUNTER — Ambulatory Visit
Admission: RE | Admit: 2023-11-23 | Discharge: 2023-11-23 | Disposition: A | Discharge status: Home / Self Care | Attending: Family | Admitting: Family

## 2023-11-23 ENCOUNTER — Ambulatory Visit: Payer: Self-pay | Admitting: Family

## 2023-11-23 ENCOUNTER — Encounter: Payer: Self-pay | Admitting: Gastroenterology

## 2023-11-23 DIAGNOSIS — M79642 Pain in left hand: Secondary | ICD-10-CM | POA: Diagnosis present

## 2023-11-23 DIAGNOSIS — M79641 Pain in right hand: Secondary | ICD-10-CM

## 2024-01-09 ENCOUNTER — Encounter

## 2024-01-09 ENCOUNTER — Ambulatory Visit

## 2024-01-09 VITALS — Ht 61.0 in | Wt 125.0 lb

## 2024-01-09 DIAGNOSIS — Z860100 Personal history of colon polyps, unspecified: Secondary | ICD-10-CM

## 2024-01-09 MED ORDER — NA SULFATE-K SULFATE-MG SULF 17.5-3.13-1.6 GM/177ML PO SOLN: 1.0000 | Freq: Once | ORAL | 0 refills | Status: AC

## 2024-01-09 NOTE — Progress Notes (Signed)

## 2024-01-16 ENCOUNTER — Encounter: Payer: Self-pay | Admitting: Gastroenterology

## 2024-01-23 ENCOUNTER — Ambulatory Visit: Admitting: Gastroenterology

## 2024-01-23 ENCOUNTER — Encounter: Payer: Self-pay | Admitting: Gastroenterology

## 2024-01-23 VITALS — BP 142/72 | HR 56 | Temp 97.3°F | Resp 11 | Ht 61.0 in | Wt 125.0 lb

## 2024-01-23 DIAGNOSIS — K641 Second degree hemorrhoids: Secondary | ICD-10-CM | POA: Diagnosis not present

## 2024-01-23 DIAGNOSIS — Q439 Congenital malformation of intestine, unspecified: Secondary | ICD-10-CM

## 2024-01-23 DIAGNOSIS — K5730 Diverticulosis of large intestine without perforation or abscess without bleeding: Secondary | ICD-10-CM

## 2024-01-23 DIAGNOSIS — K644 Residual hemorrhoidal skin tags: Secondary | ICD-10-CM

## 2024-01-23 DIAGNOSIS — Z1211 Encounter for screening for malignant neoplasm of colon: Secondary | ICD-10-CM

## 2024-01-23 DIAGNOSIS — K602 Anal fissure, unspecified: Secondary | ICD-10-CM | POA: Diagnosis not present

## 2024-01-23 DIAGNOSIS — Z860100 Personal history of colon polyps, unspecified: Secondary | ICD-10-CM

## 2024-01-23 DIAGNOSIS — Z860101 Personal history of adenomatous and serrated colon polyps: Secondary | ICD-10-CM | POA: Diagnosis not present

## 2024-01-23 MED ORDER — SODIUM CHLORIDE 0.9 % IV SOLN: 500.0000 mL | Freq: Once | INTRAVENOUS | Status: DC

## 2024-01-23 NOTE — Progress Notes (Signed)
 GASTROENTEROLOGY PROCEDURE H&P NOTE   Primary Care Physician: Dineen Rollene MATSU, FNP  HPI: Kristen Sandoval is a 68 y.o. female who presents for Colonoscopy for surveillance of previous adenomas and advanced adenomas.  Past Medical History:  Diagnosis Date   Allergy    Anemia    Anxiety    Arthritis    Asthma    Bunion of left foot    GERD (gastroesophageal reflux disease)    Hallux valgus (acquired), left foot    Hyperlipidemia    Pneumonia    Pre-diabetes    Past Surgical History:  Procedure Laterality Date   ABDOMINAL HYSTERECTOMY  1990's   for noncancerous reasons, ovarian cyst and menorrhagia; NO cervix on exam 10/18/2016   ARTHRODESIS METATARSAL Left 09/08/2023   Procedure: FUSION, JOINT, INVOLVING METATARSAL BONE Metatarsal cuneiform fusion second and third metatarsal cuneiform joints;  Surgeon: Ashley Soulier, DPM;  Location: [facility];  Service: Orthopedics/Podiatry;  Laterality: Left;   CARPAL TUNNEL RELEASE Right    COLONOSCOPY  2012   in [place], [place]-Normal exam per pt   HALLUX VALGUS LAPIDUS Left 09/08/2023   Procedure: ROMAYNE LOOK;  Surgeon: Ashley Soulier, DPM;  Location: [facility];  Service: Orthopedics/Podiatry;  Laterality: Left;   HAMMER TOE SURGERY Left 09/08/2023   Procedure: CORRECTION, HAMMER TOE;  Surgeon: Ashley Soulier, DPM;  Location: [facility];  Service: Orthopedics/Podiatry;  Laterality: Left;   JOINT REPLACEMENT Left 01/2023   knee   KNEE SURGERY Left 09/2009   OOPHORECTOMY Bilateral 1990's   TARSAL TUNNEL RELEASE  09/08/2023   Procedure: RELEASE, TARSAL TUNNEL  Metatarsophalangeal joint release of the second MTPJ;  Surgeon: Ashley Soulier, DPM;  Location: [facility];  Service: Orthopedics/Podiatry;;   TONSILLECTOMY AND ADENOIDECTOMY     UPPER GASTROINTESTINAL ENDOSCOPY  2012   in [place]   Current Outpatient Medications  Medication Sig Dispense Refill   Cholecalciferol (VITAMIN D3) 50 MCG (2000 UT) TABS Take 2,000 Units by mouth daily.      FLUoxetine  (PROZAC ) 20 MG capsule Take 1 capsule (20 mg total) by mouth every morning. 90 capsule 3   hydroxychloroquine (PLAQUENIL) 200 MG tablet Take 200 mg by mouth.     ibuprofen  (ADVIL ) 800 MG tablet Take 1 tablet (800 mg total) by mouth daily as needed (pain.). 30 tablet 1   raloxifene (EVISTA) 60 MG tablet Take 60 mg by mouth daily after breakfast.     Saccharomyces boulardii (PROBIOTIC) 250 MG CAPS Take by mouth.     vitamin k 100 MCG tablet Take 100 mcg by mouth daily.     No current facility-administered medications for this visit.    Current Outpatient Medications:    Cholecalciferol (VITAMIN D3) 50 MCG (2000 UT) TABS, Take 2,000 Units by mouth daily., Disp: , Rfl:    FLUoxetine  (PROZAC ) 20 MG capsule, Take 1 capsule (20 mg total) by mouth every morning., Disp: 90 capsule, Rfl: 3   hydroxychloroquine (PLAQUENIL) 200 MG tablet, Take 200 mg by mouth., Disp: , Rfl:    ibuprofen  (ADVIL ) 800 MG tablet, Take 1 tablet (800 mg total) by mouth daily as needed (pain.)., Disp: 30 tablet, Rfl: 1   raloxifene (EVISTA) 60 MG tablet, Take 60 mg by mouth daily after breakfast., Disp: , Rfl:    Saccharomyces boulardii (PROBIOTIC) 250 MG CAPS, Take by mouth., Disp: , Rfl:    vitamin k 100 MCG tablet, Take 100 mcg by mouth daily., Disp: , Rfl:  Allergies  Allergen Reactions   Biaxin [Clarithromycin] Itching and Nausea  And Vomiting   Latex Swelling    Typically oral swelling  Other Reaction(s): edema   Erythromycin     vomiting   Family History  Adopted: Yes  Problem Relation Age of Onset   Lung cancer Mother    Breast cancer Neg Hx    Social History   Socioeconomic History   Marital status: Married    Spouse name: Vassey,Eddie (Spouse)   Number of children: Not on file   Years of education: Not on file   Highest education level: Associate degree: occupational, scientist, product/process development, or vocational program  Occupational History   Not on file  Tobacco Use   Smoking status: Former     Current packs/day: 0.00    Average packs/day: 0.3 packs/day for 31.0 years (7.8 ttl pk-yrs)    Types: Cigarettes    Start date: 02/28/1975    Quit date: 02/27/2006    Years since quitting: 17.9   Smokeless tobacco: Never   Tobacco comments:    2006  Vaping Use   Vaping status: Never Used  Substance and Sexual Activity   Alcohol use: Yes    Alcohol/week: 8.0 standard drinks of alcohol    Types: 8 Standard drinks or equivalent per week    Comment: Moderate use   Drug use: No   Sexual activity: Not on file  Other Topics Concern   Not on file  Social History Narrative   2 children- son and daughter      Working- print production planner for [employer]         Social Drivers of Health   Financial Resource Strain: Low Risk  (12/25/2023)   Received from [facility]   Overall Financial Resource Strain (CARDIA)    Difficulty of Paying Living Expenses: Not hard at all  Food Insecurity: No Food Insecurity (12/25/2023)   Received from [facility]   Hunger Vital Sign    Within the past 12 months, you worried that your food would run out before you got the money to buy more.: Never true    Within the past 12 months, the food you bought just didn't last and you didn't have money to get more.: Never true  Transportation Needs: No Transportation Needs (12/25/2023)   [facility] from [facility] - Transportation    In the past 12 months, has lack of transportation kept you from medical appointments or from getting medications?: No    Lack of Transportation (Non-Medical): No  Physical Activity: Insufficiently Active (11/10/2023)   Exercise Vital Sign    Days of Exercise per Week: 2 days    Minutes of Exercise per Session: 10 min  Stress: Not on file  Social Connections: Moderately Integrated (11/10/2023)   Social Connection and Isolation Panel    Frequency of Communication with Friends and Family: More than three times a week     Frequency of Social Gatherings with Friends and Family: Once a week    Attends Religious Services: 1 to 4 times per year    Active Member of [employer] or Organizations: No    Attends Engineer, Structural: Not on file    Marital Status: Married  Catering Manager Violence: Not on file    Physical Exam: There were no vitals filed for this visit. There is no height or weight on file to calculate BMI. GEN: NAD EYE: Sclerae anicteric ENT: MMM CV: Non-tachycardic GI: Soft, NT/ND NEURO:  Alert & Oriented x 3  Lab Results:  No results for input(s): WBC, HGB, HCT, PLT in the last 72 hours. BMET No results for input(s): NA, K, CL, CO2, GLUCOSE, BUN, CREATININE, CALCIUM in the last 72 hours. LFT No results for input(s): PROT, ALBUMIN, AST, ALT, ALKPHOS, BILITOT, BILIDIR, IBILI in the last 72 hours. PT/INR No results for input(s): LABPROT, INR in the last 72 hours.   Impression / Plan: This is a 68 y.o.female  who presents for Colonoscopy for surveillance of previous adenomas and advanced adenomas.  The risks and benefits of endoscopic evaluation/treatment were discussed with the patient and/or family; these include but are not limited to the risk of perforation, infection, bleeding, missed lesions, lack of diagnosis, severe illness requiring hospitalization, as well as anesthesia and sedation related illnesses.  The patient's history has been reviewed, patient examined, no change in status, and deemed stable for procedure.  The patient and/or family was provided an opportunity to ask questions and all were answered.  The patient and/or family is agreeable to proceed.    Aloha Finner, MD [facility]oscopy Office # [phone]

## 2024-01-23 NOTE — Progress Notes (Signed)
 Pt's states no medical or surgical changes since previsit or office visit.

## 2024-01-23 NOTE — Op Note (Signed)
  [facility] Patient Name: Kristen Sandoval Procedure Date: 01/23/2024 9:00 AM MRN: [id number] Endoscopist: Aloha Finner , MD, [id number] Age: 68 Referring MD:  Date of Birth: 06-Aug-1955 Gender: Female Account #: [id number] Procedure:                Colonoscopy Indications:              Surveillance: Personal history of adenomatous                            polyps on last colonoscopy 3 years ago, High risk                            colon cancer surveillance: Personal history of                            adenoma (10 mm or greater in size), High risk colon                            cancer surveillance: Personal history of adenoma                            less than 10 mm in size Medicines:                Monitored Anesthesia Care Procedure:                Pre-Anesthesia Assessment:                           - Prior to the procedure, a History and Physical                            was performed, and patient medications and                            allergies were reviewed. The patient's tolerance of                            previous anesthesia was also reviewed. The risks                            and benefits of the procedure and the sedation                            options and risks were discussed with the patient.                            All questions were answered, and informed consent                            was obtained. Prior Anticoagulants: The patient has                            taken no anticoagulant or antiplatelet agents. ASA  Grade Assessment: II - A patient with mild systemic                            disease. After reviewing the risks and benefits,                            the patient was deemed in satisfactory condition to                            undergo the procedure.                           After obtaining informed consent, the colonoscope                            was passed under direct vision.  Throughout the                            procedure, the patient's blood pressure, pulse, and                            oxygen saturations were monitored continuously. The                            Olympus Scope [id number] was introduced through the                            anus and advanced to the 3 cm into the ileum. The                            colonoscopy was performed without difficulty. The                            patient tolerated the procedure. The quality of the                            bowel preparation was good. The terminal ileum,                            ileocecal valve, appendiceal orifice, and rectum                            were photographed. Scope In: 9:07:39 AM Scope Out: 9:14:55 AM Scope Withdrawal Time: 0 hours 5 minutes 35 seconds  Total Procedure Duration: 0 hours 7 minutes 16 seconds  Findings:                 Skin tags were found on perianal exam.                           The digital rectal exam findings include anal                            fissure and hemorrhoids. Pertinent negatives  include no palpable rectal lesions.                           The left colon was moderately tortuous.                           The terminal ileum and ileocecal valve appeared                            normal.                           Multiple small-mouthed diverticula were found in                            the recto-sigmoid colon, sigmoid colon, descending                            colon and transverse colon.                           Normal mucosa was found in the entire colon                            otherwise.                           Non-bleeding non-thrombosed external and internal                            hemorrhoids were found during retroflexion, during                            perianal exam and during digital exam. The                            hemorrhoids were Grade II (internal hemorrhoids                             that prolapse but reduce spontaneously). Complications:            No immediate complications. Estimated Blood Loss:     Estimated blood loss was minimal. Impression:               - Anal fissure and hemorrhoids found on digital                            rectal exam. Skin tags noted on perineal exam.                           - Tortuous left colon.                           - The examined portion of the ileum was normal.                           - Diverticulosis in the  recto-sigmoid colon, in the                            sigmoid colon, in the descending colon and in the                            transverse colon.                           - Normal mucosa in the entire examined colon                            otherwise.                           - Non-bleeding non-thrombosed external and internal                            hemorrhoids. Recommendation:           - The patient will be observed post-procedure,                            until all discharge criteria are met.                           - Discharge patient to home.                           - Patient has a contact number available for                            emergencies. The signs and symptoms of potential                            delayed complications were discussed with the                            patient. Return to normal activities tomorrow.                            Written discharge instructions were provided to the                            patient.                           - High fiber diet.                           - Use FiberCon 1-2 tablets PO daily.                           - Recommend Recticare or Preparation H Lidocaine                             jelly 2 times daily for next 2-3 weeks  to aid in                            healing of the anal fissure noted.                           - Repeat colonoscopy in 5 years for surveillance                            due to history of previous advanced  adenomas.                           - The findings and recommendations were discussed                            with the patient.                           - The findings and recommendations were discussed                            with the patient's family. Aloha Finner, MD 01/23/2024 9:20:12 AM

## 2024-01-23 NOTE — Patient Instructions (Signed)
 The patient will be observed post-procedure, until all discharge criteria are met.  - Discharge patient to home. - Patient has a contact number available for emergencies. The signs and symptoms of potential delayed complications were discussed with the patient. Return to normal activities tomorrow.   Written discharge instructions were provided to the  patient.  - High fiber diet. - Use FiberCon 1-2 tablets PO daily. - Recommend Recticare or Preparation H Lidocaine   jelly 2 times daily for next 2-3 weeks to aid in healing of the anal fissure noted. - Repeat colonoscopy in 5 years for surveillance due to history of previous advanced adenomas.                            YOU HAD AN ENDOSCOPIC PROCEDURE TODAY AT THE [facility]:   Refer to the procedure report that was given to you for any specific questions about what was found during the examination.  If the procedure report does not answer your questions, please call your gastroenterologist to clarify.  If you requested that your care partner not be given the details of your procedure findings, then the procedure report has been included in a sealed envelope for you to review at your convenience later.  YOU SHOULD EXPECT: Some feelings of bloating in the abdomen. Passage of more gas than usual.  Walking can help get rid of the air that was put into your GI tract during the procedure and reduce the bloating. If you had a lower endoscopy (such as a colonoscopy or flexible sigmoidoscopy) you may notice spotting of blood in your stool or on the toilet paper. If you underwent a bowel prep for your procedure, you may not have a normal bowel movement for a few days.  Please Note:  You might notice some irritation and congestion in your nose or some drainage.  This is from the oxygen used during your procedure.  There is no need for concern and it should clear up in a day or so.  SYMPTOMS TO REPORT IMMEDIATELY:  Following lower endoscopy  (colonoscopy or flexible sigmoidoscopy):  Excessive amounts of blood in the stool  Significant tenderness or worsening of abdominal pains  Swelling of the abdomen that is new, acute  Fever of 100F or higher  For urgent or emergent issues, a gastroenterologist can be reached at any hour by calling [phone]. Do not use MyChart messaging for urgent concerns.    DIET:  We do recommend a small meal at first, but then you may proceed to your regular diet.  Drink plenty of fluids but you should avoid alcoholic beverages for 24 hours.  ACTIVITY:  You should plan to take it easy for the rest of today and you should NOT DRIVE or use heavy machinery until tomorrow (because of the sedation medicines used during the test).    FOLLOW UP: Our staff will call the number listed on your records the next business day following your procedure.  We will call around 7:15- 8:00 am to check on you and address any questions or concerns that you may have regarding the information given to you following your procedure. If we do not reach you, we will leave a message.     If any biopsies were taken you will be contacted by phone or by letter within the next 1-3 weeks.  Please call us  at [phone] if you have not heard about the biopsies in 3 weeks.  SIGNATURES/CONFIDENTIALITY: You and/or your care partner have signed paperwork which will be entered into your electronic medical record.  These signatures attest to the fact that that the information above on your After Visit Summary has been reviewed and is understood.  Full responsibility of the confidentiality of this discharge information lies with you and/or your care-partner.

## 2024-01-23 NOTE — Progress Notes (Signed)
 Sedate, gd SR, tolerated procedure well, VSS, report to RN

## 2024-01-24 ENCOUNTER — Telehealth: Payer: Self-pay | Admitting: *Deleted

## 2024-01-24 NOTE — Telephone Encounter (Signed)
 Left message on f/u call

## 2024-02-02 ENCOUNTER — Encounter

## 2024-02-02 ENCOUNTER — Inpatient Hospital Stay: Admission: RE | Admit: 2024-02-02 | Discharge: 2024-02-02 | Attending: Family

## 2024-02-02 DIAGNOSIS — Z1231 Encounter for screening mammogram for malignant neoplasm of breast: Secondary | ICD-10-CM

## 2024-11-14 ENCOUNTER — Encounter

## 2024-11-15 ENCOUNTER — Encounter: Admitting: Family
